# Patient Record
Sex: Male | Born: 1975
Health system: Southern US, Community
[De-identification: ages and names within clinical notes are randomized; demographics above are authoritative.]

## PROBLEM LIST (undated history)

## (undated) ENCOUNTER — Emergency Department (HOSPITAL_COMMUNITY): Admission: EM | Payer: Self-pay

## (undated) DIAGNOSIS — I10 Essential (primary) hypertension: Secondary | ICD-10-CM

## (undated) DIAGNOSIS — T7840XA Allergy, unspecified, initial encounter: Secondary | ICD-10-CM

## (undated) HISTORY — DX: Allergy, unspecified, initial encounter: T78.40XA

## (undated) HISTORY — DX: Essential (primary) hypertension: I10

---

## 2001-05-14 ENCOUNTER — Emergency Department (HOSPITAL_COMMUNITY): Admission: EM | Admit: 2001-05-14 | Discharge: 2001-05-14 | Payer: Self-pay | Admitting: *Deleted

## 2003-01-29 HISTORY — PX: KIDNEY DONATION: SHX685

## 2003-09-12 ENCOUNTER — Emergency Department (HOSPITAL_COMMUNITY): Admission: EM | Admit: 2003-09-12 | Discharge: 2003-09-12 | Payer: Self-pay | Admitting: Emergency Medicine

## 2004-10-06 ENCOUNTER — Emergency Department (HOSPITAL_COMMUNITY): Admission: EM | Admit: 2004-10-06 | Discharge: 2004-10-06 | Payer: Self-pay | Admitting: Family Medicine

## 2006-07-05 ENCOUNTER — Emergency Department (HOSPITAL_COMMUNITY): Admission: EM | Admit: 2006-07-05 | Discharge: 2006-07-05 | Payer: Self-pay | Admitting: Family Medicine

## 2009-03-22 ENCOUNTER — Emergency Department (HOSPITAL_COMMUNITY): Admission: EM | Admit: 2009-03-22 | Discharge: 2009-03-22 | Payer: Self-pay | Admitting: Emergency Medicine

## 2009-07-13 ENCOUNTER — Emergency Department (HOSPITAL_COMMUNITY): Admission: EM | Admit: 2009-07-13 | Discharge: 2009-07-13 | Payer: Self-pay | Admitting: Emergency Medicine

## 2010-04-20 LAB — GC/CHLAMYDIA PROBE AMP, GENITAL
Chlamydia, DNA Probe: NEGATIVE
GC Probe Amp, Genital: NEGATIVE

## 2013-05-26 ENCOUNTER — Ambulatory Visit: Payer: Self-pay | Admitting: Physician Assistant

## 2013-05-26 VITALS — BP 130/90 | HR 73 | Temp 99.5°F | Resp 16 | Ht 70.0 in | Wt 191.0 lb

## 2013-05-26 DIAGNOSIS — Z0289 Encounter for other administrative examinations: Secondary | ICD-10-CM

## 2013-05-26 NOTE — Progress Notes (Signed)
This patient presents for DOT examination for fitness for duty.  Last DOT certification was for 2 years, expiration date 02/2013.  Medical History: no  Any illness or injury in the last 5 years? no  Head/Brain Injuries, disorders or illnesses no  Seizures, epilepsy no  Eye disorders or impaired vision (except corrective lenses) no  Ear disorders, loss of hearing or balance no  Heart disease or heart attack; other cardiovascular condition no  Heart surgery (valve replacement/bypass, angioplasty, pacemaker) no  High blood pressure no  Muscular disease no  Shortness of breath no  Lung disease, emphysema, asthma, chronic bronchitis no  Kidney disease, dialysis no  Liver disease no  Digestive problems no  Diabetes or elevated blood sugar no  Nervious or psychiatric disorders, e.g., severe depression no  Loss of, or altered consciousness no  Fainting, dizziness no  Sleep disorders, pauses in breathing while asleep, daytime sleepiness, loud snoring no  Stroke or paralysis no  Missing or impaired hand, arm, foot, leg, finger, toe no  Spinal injury or disease no  Chronic low back pain no  Regular, frequent alcohol use no  Narcotic or habit forming drug use  Current Medications: Prior to Admission medications   Not on File    Primary Care Provider: No PCP Per Patient Specialists: none  Medical Examiner's Comments on Health History:  Allergic rhinitis. Does not use antihistamines that cause drowsiness. USes nasal decongestant spray.  TESTING:   Visual Acuity Screening   Right eye Left eye Both eyes  Without correction: 15-2 20/20 15-1  With correction:     Comments: Peripheral Vision: Right eye 85 degrees. Left eye 85 degrees.The patient can distinguish the colors red, amber and green.  Hearing Screening Comments: The patient was able to hear a forced whisper from 10 feet.  Monocular Vision: no  Hearing Aid used for test: no Hearing Aid required to to meet standard:  no  BP 130/90  Pulse 73  Temp(Src) 99.5 F (37.5 C) (Oral)  Resp 16  Ht 5\' 10"  (1.778 m)  Wt 191 lb (86.637 kg)  BMI 27.41 kg/m2  SpO2 99% Pulse rate is regular  Urine Specimen: Specific Gravity 1.010, Protein TRACE, Blood NEG, Sugar NEG  Other Testing: none indicated  PHYSICAL EXAMINATION:  1. yes General Appearance 2. no Eyes   3. no Ears     4. no Mouth and Throat    5. no Heart     6. no Lungs and Chest, not including breast examination  7. no Abdomen and Viscera   8. no Vascular System    9. no Genitourinary System   10. no Extremities-Limb impaired.  11. no Spine, other musculoskeletal  12. no Neurological     Comments: patient is obese. Counseled on healthy eating and regular exercise for weight loss and reduction of cardiovascular risk factors.  Certification Status: does meet standards for 2 year certificate.  Certification expires 05/27/2015

## 2013-05-26 NOTE — Patient Instructions (Signed)
Stop using Afrin, and any other nasal decongestant sprays. Use Flonase or Nasonex instead. Your nasal congestion will get worse at first, and then will improve. Instead of using "cold and allergy" combination medication, use Claritin (loratadine) or Allegra (fexofenadine). They won't make you sleepy, and are safe to use when driving.

## 2013-05-26 NOTE — Progress Notes (Signed)
U/A SpGr- 1.010 Protein- Trace Blood- Neg Glucose- Neg

## 2014-04-18 ENCOUNTER — Emergency Department (HOSPITAL_COMMUNITY)
Admission: EM | Admit: 2014-04-18 | Discharge: 2014-04-18 | Disposition: A | Discharge status: Home / Self Care | Payer: Self-pay | Source: Home / Self Care

## 2014-04-18 NOTE — ED Notes (Signed)
Patient was called two times and taken off the floor

## 2016-02-20 DIAGNOSIS — J3489 Other specified disorders of nose and nasal sinuses: Secondary | ICD-10-CM | POA: Diagnosis not present

## 2016-02-20 DIAGNOSIS — G4733 Obstructive sleep apnea (adult) (pediatric): Secondary | ICD-10-CM | POA: Diagnosis not present

## 2016-03-14 ENCOUNTER — Ambulatory Visit: Payer: BLUE CROSS/BLUE SHIELD | Attending: Otolaryngology

## 2016-03-14 DIAGNOSIS — G4733 Obstructive sleep apnea (adult) (pediatric): Secondary | ICD-10-CM | POA: Insufficient documentation

## 2016-03-14 DIAGNOSIS — R0683 Snoring: Secondary | ICD-10-CM | POA: Diagnosis not present

## 2016-05-30 DIAGNOSIS — G4733 Obstructive sleep apnea (adult) (pediatric): Secondary | ICD-10-CM | POA: Diagnosis not present

## 2016-06-07 DIAGNOSIS — G4733 Obstructive sleep apnea (adult) (pediatric): Secondary | ICD-10-CM | POA: Diagnosis not present

## 2016-07-08 DIAGNOSIS — G4733 Obstructive sleep apnea (adult) (pediatric): Secondary | ICD-10-CM | POA: Diagnosis not present

## 2016-08-07 DIAGNOSIS — G4733 Obstructive sleep apnea (adult) (pediatric): Secondary | ICD-10-CM | POA: Diagnosis not present

## 2016-08-11 ENCOUNTER — Encounter (HOSPITAL_COMMUNITY): Payer: Self-pay | Admitting: *Deleted

## 2016-08-11 ENCOUNTER — Ambulatory Visit (HOSPITAL_COMMUNITY)
Admission: EM | Admit: 2016-08-11 | Discharge: 2016-08-11 | Disposition: A | Discharge status: Home / Self Care | Payer: BLUE CROSS/BLUE SHIELD | Attending: Internal Medicine | Admitting: Internal Medicine

## 2016-08-11 DIAGNOSIS — M5416 Radiculopathy, lumbar region: Secondary | ICD-10-CM | POA: Diagnosis not present

## 2016-08-11 MED ORDER — CYCLOBENZAPRINE HCL 10 MG PO TABS: 5.0000 mg | ORAL_TABLET | Freq: Three times a day (TID) | ORAL | 0 refills | Status: DC | PRN

## 2016-08-11 MED ORDER — PREDNISONE 50 MG PO TABS: 50.0000 mg | ORAL_TABLET | Freq: Every day | ORAL | 0 refills | Status: AC

## 2016-08-11 MED ORDER — NAPROXEN 500 MG PO TABS: 500.0000 mg | ORAL_TABLET | Freq: Two times a day (BID) | ORAL | 0 refills | Status: AC

## 2016-08-11 NOTE — Discharge Instructions (Signed)
Start the prednisone and take as directed for three days.  Okay to start the flexeril as long as you are not driving within six hours of taking.  Start naprosyn if needed after finishing the prednisone.

## 2016-08-11 NOTE — ED Triage Notes (Signed)
C/O bilat thigh numbness and pain x 1.5 months.  Denies any back or buttock pain.  Has been using BioFreeze and using massage without relief.  Denies loss of control of bowels or bladder.

## 2016-08-11 NOTE — ED Provider Notes (Signed)
    08/11/2016 5:34 PM   DOB: 02/27/1975 / MRN: [id number]  SUBJECTIVE:  Fred Holt is a 41 y.o. male presenting for bilateral buring and pain about the L2-L3 dermatome.  This has been present and stable for about 1 month.  He has tried tylenol.  Denies a history of back injury or trauma.  Was a football player in highschool.  It currently a truck driver.   He has No Known Allergies.   He  has a past medical history of Allergy.    He  reports that he has never smoked. He has never used smokeless tobacco. He reports that he does not drink alcohol or use drugs. He  has no sexual activity history on file. The patient  has a past surgical history that includes Kidney donation (Left, 2005).  His family history includes Diabetes in his mother; Kidney disease in his sister.  Review of Systems  Musculoskeletal: Positive for myalgias. Negative for back pain, falls, joint pain and neck pain.  Skin: Negative for rash.  Neurological: Negative for dizziness and focal weakness.    The problem list and medications weare reviewed and updated by myself where necessary and exist elsewhere in the encounter.   OBJECTIVE:  BP 139/65   Pulse 69   Temp 98.3 F (36.8 C) (Oral)   Resp 16   SpO2 98%   Physical Exam  Constitutional: He is oriented to person, place, and time. He appears well-developed. He is active and cooperative.  Non-toxic appearance.  Eyes: Pupils are equal, round, and reactive to light. EOM are normal.  Cardiovascular: Normal rate.   Pulmonary/Chest: Effort normal. No tachypnea.  Musculoskeletal: Normal range of motion. He exhibits no edema, tenderness or deformity.  Neurological: He is alert and oriented to person, place, and time. He has normal strength and normal reflexes. He is not disoriented. He displays normal reflexes. No cranial nerve deficit or sensory deficit. He exhibits normal muscle tone. Coordination and gait normal.  Skin: Skin is warm and dry. He is not  diaphoretic. No pallor.  Psychiatric: His behavior is normal.  Vitals reviewed.   No results found for this or any previous visit (from the past 72 hour(s)).  No results found.  ASSESSMENT AND PLAN:  Lumbar radicular pain  No concerning exam findings.  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  An xray would not change my management. Will treat symptomatically for now.   The patient is advised to call or return to clinic if he does not see an improvement in symptoms, or to seek the care of the closest emergency department if he worsens with the above plan.   Deliah BostonMichael Clark, MHS, PA-C [facility] 08/11/2016 5:34 PM    Ofilia Neaslark, Michael L, PA-C 08/11/16 1750

## 2016-08-20 ENCOUNTER — Encounter: Payer: Self-pay | Admitting: Family Medicine

## 2016-08-20 ENCOUNTER — Ambulatory Visit (INDEPENDENT_AMBULATORY_CARE_PROVIDER_SITE_OTHER): Payer: BLUE CROSS/BLUE SHIELD

## 2016-08-20 ENCOUNTER — Ambulatory Visit (INDEPENDENT_AMBULATORY_CARE_PROVIDER_SITE_OTHER): Payer: BLUE CROSS/BLUE SHIELD | Admitting: Family Medicine

## 2016-08-20 VITALS — BP 147/85 | HR 73 | Temp 98.9°F | Resp 17 | Ht 70.0 in | Wt 300.6 lb

## 2016-08-20 DIAGNOSIS — R7303 Prediabetes: Secondary | ICD-10-CM | POA: Diagnosis not present

## 2016-08-20 DIAGNOSIS — R030 Elevated blood-pressure reading, without diagnosis of hypertension: Secondary | ICD-10-CM | POA: Diagnosis not present

## 2016-08-20 DIAGNOSIS — M545 Low back pain, unspecified: Secondary | ICD-10-CM

## 2016-08-20 DIAGNOSIS — Z833 Family history of diabetes mellitus: Secondary | ICD-10-CM

## 2016-08-20 DIAGNOSIS — Z8249 Family history of ischemic heart disease and other diseases of the circulatory system: Secondary | ICD-10-CM | POA: Diagnosis not present

## 2016-08-20 DIAGNOSIS — Z131 Encounter for screening for diabetes mellitus: Secondary | ICD-10-CM

## 2016-08-20 DIAGNOSIS — Z125 Encounter for screening for malignant neoplasm of prostate: Secondary | ICD-10-CM

## 2016-08-20 NOTE — Progress Notes (Signed)
Chief Complaint  Patient presents with  . 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  He was given prednisone, flexeril and naproxen He is still taking flexeril and naproxen He states that  He denies numbness and tingling He reports that   Elevated BP without diagnosis of hypertension BP Readings from Last 3 Encounters:  08/20/16 (!) 147/85  08/11/16 139/65  05/26/13 130/90  he reports that his father has hypertension and so does his mother He takes a pill He reports that he has been gaining weight He states that when he gets off work late sometimes He is exercising sometimes and is trying to improve his diet Wt Readings from Last 3 Encounters:  08/20/16 (!) 300 lb 9.6 oz (136.4 kg)  05/26/13 191 lb (86.6 kg)       Past Medical History:  Diagnosis Date  . Allergy     Current Outpatient Prescriptions  Medication Sig Dispense Refill  . cyclobenzaprine (FLEXERIL) 10 MG tablet Take 0.5-1 tablets (5-10 mg total) by mouth 3 (three) times daily as needed for muscle spasms. 30 tablet 0  . MULTIPLE VITAMINS PO Take by mouth.    . naproxen (NAPROSYN) 500 MG tablet Take 1 tablet (500 mg total) by mouth 2 (two) times daily with a meal. Avoid NSAID medications while taking this. 20 tablet 0   No current facility-administered medications for this visit.     Allergies: Not on File  Past Surgical History:  Procedure Laterality Date  . KIDNEY DONATION Left 2005   donation to sister    Social History   Social History    . Marital status: Single    Spouse name: n/a  . Number of children: 6  . Years of education: 5414   Occupational History  . truck driver    Social History Main Topics  . Smoking status: Never Smoker  . Smokeless tobacco: Never Used  . Alcohol use No  . Drug use: No  . Sexual activity: Not Asked   Other Topics Concern  . None   Social History Narrative   Lives alone. Sees his children daily.    ROS See hpi  Objective: Vitals:   08/20/16 1655 08/20/16 1657  BP: (!) 160/102 (!) 147/85  Pulse: 76 73  Resp: 17   Temp: 98.9 F (37.2 C)   TempSrc: Oral   SpO2: 94%   Weight: (!) 300 lb 9.6 oz (136.4 kg)   Height: 5\' 10"  (1.778 m)   Body mass index is 43.13 kg/m.   Physical Exam  Constitutional: He is oriented to person, place, and time. He appears well-developed and well-nourished.  HENT:  Head: Normocephalic and atraumatic.  Cardiovascular: Normal rate, regular rhythm and normal heart sounds.   Pulmonary/Chest: Effort normal and breath sounds normal. No respiratory distress. He has no wheezes.  Musculoskeletal:       Lumbar back: He exhibits tenderness and spasm. He  exhibits normal range of motion, no bony tenderness, no swelling, no edema, no deformity, no laceration, no pain and normal pulse.  Neurological: He is alert and oriented to person, place, and time. He displays normal reflexes. No cranial nerve deficit. Coordination normal.   XRAY 08/20/16: Mild degenerative change without acute abnormality  Assessment and Plan Adynn was seen today for hospitalization follow-up.  Diagnoses and all orders for this visit:  Elevated BP without diagnosis of hypertension Pt prehypertensive Discussed how the diagnosis of hypertension is made Will monitor Discussed nsaid use as this can raise the blood pressure -     Comprehensive metabolic panel  Morbid obesity (HCC)- will screen for modifiable risk factors Discussed exercise programs -     Lipid panel -      Hemoglobin A1c  Family history of hypertension Family history of diabetes mellitus in mother Pt currently prediabetic He could benefit from increased exercise and weight loss to prevent hypertension and diabetes He declined nutrition referral today -     Hemoglobin A1c  Lumbar pain- concerning about wear and tear degenerative disease vs. Muscular strain Will refer to PT No neuro deficits noted on exam -     Ambulatory referral to Physical Therapy -     DG Lumbar Spine Complete  Screening for diabetes mellitus- will screen based on age -     Hemoglobin A1c  Screening for prostate cancer -     PSA     Fred Holt A Creta Levin

## 2016-08-20 NOTE — Patient Instructions (Addendum)
   IF you received an x-ray today, you will receive an invoice from [facility]. Please contact [facility] at [phone] with questions or concerns regarding your invoice.   IF you received labwork today, you will receive an invoice from [facility]. Please contact [facility] at [phone] with questions or concerns regarding your invoice.   Our billing staff will not be able to assist you with questions regarding bills from these companies.  You will be contacted with the lab results as soon as they are available. The fastest way to get your results is to activate your My Chart account. Instructions are located on the last page of this paperwork. If you have not heard from us regarding the results in 2 weeks, please contact this office.     DASH Eating Plan DASH stands for "Dietary Approaches to Stop Hypertension." The DASH eating plan is a healthy eating plan that has been shown to reduce high blood pressure (hypertension). It may also reduce your risk for type 2 diabetes, heart disease, and stroke. The DASH eating plan may also help with weight loss. What are tips for following this plan? General guidelines   Avoid eating more than 2,300 mg (milligrams) of salt (sodium) a day. If you have hypertension, you may need to reduce your sodium intake to 1,500 mg a day.  Limit alcohol intake to no more than 1 drink a day for nonpregnant women and 2 drinks a day for men. One drink equals 12 oz of beer, 5 oz of wine, or 1 oz of hard liquor.  Work with your health care provider to maintain a healthy body weight or to lose weight. Ask what an ideal weight is for you.  Get at least 30 minutes of exercise that causes your heart to beat faster (aerobic exercise) most days of the week. Activities may include walking, swimming, or biking.  Work with your health care provider or diet and nutrition specialist (dietitian) to adjust your eating plan to your individual calorie  needs. Reading food labels   Check food labels for the amount of sodium per serving. Choose foods with less than 5 percent of the Daily Value of sodium. Generally, foods with less than 300 mg of sodium per serving fit into this eating plan.  To find whole grains, look for the word "whole" as the first word in the ingredient list. Shopping   Buy products labeled as "low-sodium" or "no salt added."  Buy fresh foods. Avoid canned foods and premade or frozen meals. Cooking   Avoid adding salt when cooking. Use salt-free seasonings or herbs instead of table salt or sea salt. Check with your health care provider or pharmacist before using salt substitutes.  Do not fry foods. Cook foods using healthy methods such as baking, boiling, grilling, and broiling instead.  Cook with heart-healthy oils, such as olive, canola, soybean, or sunflower oil. Meal planning    Eat a balanced diet that includes:  5 or more servings of fruits and vegetables each day. At each meal, try to fill half of your plate with fruits and vegetables.  Up to 6-8 servings of whole grains each day.  Less than 6 oz of lean meat, poultry, or fish each day. A 3-oz serving of meat is about the same size as a deck of cards. One egg equals 1 oz.  2 servings of low-fat dairy each day.  A serving of nuts, seeds, or beans 5 times each week.  Heart-healthy fats. Healthy fats called   Omega-3 fatty acids are found in foods such as flaxseeds and coldwater fish, like sardines, salmon, and mackerel.  Limit how much you eat of the following:  Canned or prepackaged foods.  Food that is high in trans fat, such as fried foods.  Food that is high in saturated fat, such as fatty meat.  Sweets, desserts, sugary drinks, and other foods with added sugar.  Full-fat dairy products.  Do not salt foods before eating.  Try to eat at least 2 vegetarian meals each week.  Eat more home-cooked food and less restaurant, buffet, and fast  food.  When eating at a restaurant, ask that your food be prepared with less salt or no salt, if possible. What foods are recommended? The items listed may not be a complete list. Talk with your dietitian about what dietary choices are best for you. Grains  Whole-grain or whole-wheat bread. Whole-grain or whole-wheat pasta. Brown rice. Oatmeal. Quinoa. Bulgur. Whole-grain and low-sodium cereals. Pita bread. Low-fat, low-sodium crackers. Whole-wheat flour tortillas. Vegetables  Fresh or frozen vegetables (raw, steamed, roasted, or grilled). Low-sodium or reduced-sodium tomato and vegetable juice. Low-sodium or reduced-sodium tomato sauce and tomato paste. Low-sodium or reduced-sodium canned vegetables. Fruits  All fresh, dried, or frozen fruit. Canned fruit in natural juice (without added sugar). Meat and other protein foods  Skinless chicken or turkey. Ground chicken or turkey. Pork with fat trimmed off. Fish and seafood. Egg whites. Dried beans, peas, or lentils. Unsalted nuts, nut butters, and seeds. Unsalted canned beans. Lean cuts of beef with fat trimmed off. Low-sodium, lean deli meat. Dairy  Low-fat (1%) or fat-free (skim) milk. Fat-free, low-fat, or reduced-fat cheeses. Nonfat, low-sodium ricotta or cottage cheese. Low-fat or nonfat yogurt. Low-fat, low-sodium cheese. Fats and oils  Soft margarine without trans fats. Vegetable oil. Low-fat, reduced-fat, or light mayonnaise and salad dressings (reduced-sodium). Canola, safflower, olive, soybean, and sunflower oils. Avocado. Seasoning and other foods  Herbs. Spices. Seasoning mixes without salt. Unsalted popcorn and pretzels. Fat-free sweets. What foods are not recommended? The items listed may not be a complete list. Talk with your dietitian about what dietary choices are best for you. Grains  Baked goods made with fat, such as croissants, muffins, or some breads. Dry pasta or rice meal packs. Vegetables  Creamed or fried vegetables.  Vegetables in a cheese sauce. Regular canned vegetables (not low-sodium or reduced-sodium). Regular canned tomato sauce and paste (not low-sodium or reduced-sodium). Regular tomato and vegetable juice (not low-sodium or reduced-sodium). Pickles. Olives. Fruits  Canned fruit in a light or heavy syrup. Fried fruit. Fruit in cream or butter sauce. Meat and other protein foods  Fatty cuts of meat. Ribs. Fried meat. Bacon. Sausage. Bologna and other processed lunch meats. Salami. Fatback. Hotdogs. Bratwurst. Salted nuts and seeds. Canned beans with added salt. Canned or smoked fish. Whole eggs or egg yolks. Chicken or turkey with skin. Dairy  Whole or 2% milk, cream, and half-and-half. Whole or full-fat cream cheese. Whole-fat or sweetened yogurt. Full-fat cheese. Nondairy creamers. Whipped toppings. Processed cheese and cheese spreads. Fats and oils  Butter. Stick margarine. Lard. Shortening. Ghee. Bacon fat. Tropical oils, such as coconut, palm kernel, or palm oil. Seasoning and other foods  Salted popcorn and pretzels. Onion salt, garlic salt, seasoned salt, table salt, and sea salt. Worcestershire sauce. Tartar sauce. Barbecue sauce. Teriyaki sauce. Soy sauce, including reduced-sodium. Steak sauce. Canned and packaged gravies. Fish sauce. Oyster sauce. Cocktail sauce. Horseradish that you find on the shelf. Ketchup. Mustard. Meat flavorings   and tenderizers. Bouillon cubes. Hot sauce and Tabasco sauce. Premade or packaged marinades. Premade or packaged taco seasonings. Relishes. Regular salad dressings. Where to find more information:  National Heart, Lung, and Blood Institute: www.nhlbi.nih.gov  American Heart Association: www.heart.org Summary  The DASH eating plan is a healthy eating plan that has been shown to reduce high blood pressure (hypertension). It may also reduce your risk for type 2 diabetes, heart disease, and stroke.  With the DASH eating plan, you should limit salt (sodium) intake  to 2,300 mg a day. If you have hypertension, you may need to reduce your sodium intake to 1,500 mg a day.  When on the DASH eating plan, aim to eat more fresh fruits and vegetables, whole grains, lean proteins, low-fat dairy, and heart-healthy fats.  Work with your health care provider or diet and nutrition specialist (dietitian) to adjust your eating plan to your individual calorie needs. This information is not intended to replace advice given to you by your health care provider. Make sure you discuss any questions you have with your health care provider. Document Released: 01/03/2011 Document Revised: 01/08/2016 Document Reviewed: 01/08/2016 Elsevier Interactive Patient Education  2017 [employer]  

## 2016-08-21 LAB — COMPREHENSIVE METABOLIC PANEL
ALBUMIN: 4.2 g/dL (ref 3.5–5.5)
ALK PHOS: 85 IU/L (ref 39–117)
ALT: 43 IU/L (ref 0–44)
AST: 33 IU/L (ref 0–40)
Albumin/Globulin Ratio: 1.5 (ref 1.2–2.2)
BUN / CREAT RATIO: 12 (ref 9–20)
BUN: 19 mg/dL (ref 6–24)
Bilirubin Total: 0.4 mg/dL (ref 0.0–1.2)
CO2: 24 mmol/L (ref 20–29)
CREATININE: 1.62 mg/dL — AB (ref 0.76–1.27)
Calcium: 9.7 mg/dL (ref 8.7–10.2)
Chloride: 105 mmol/L (ref 96–106)
GFR calc non Af Amer: 52 mL/min/{1.73_m2} — ABNORMAL LOW (ref >59)
GFR, EST AFRICAN AMERICAN: 60 mL/min/{1.73_m2} (ref >59)
GLOBULIN, TOTAL: 2.8 g/dL (ref 1.5–4.5)
Glucose: 92 mg/dL (ref 65–99)
Potassium: 4.6 mmol/L (ref 3.5–5.2)
SODIUM: 145 mmol/L — AB (ref 134–144)
Total Protein: 7 g/dL (ref 6.0–8.5)

## 2016-08-21 LAB — LIPID PANEL
CHOL/HDL RATIO: 5.2 ratio — AB (ref 0.0–5.0)
CHOLESTEROL TOTAL: 150 mg/dL (ref 100–199)
HDL: 29 mg/dL — ABNORMAL LOW (ref >39)
LDL CALC: 83 mg/dL (ref 0–99)
Triglycerides: 188 mg/dL — ABNORMAL HIGH (ref 0–149)
VLDL Cholesterol Cal: 38 mg/dL (ref 5–40)

## 2016-08-21 LAB — HEMOGLOBIN A1C
ESTIMATED AVERAGE GLUCOSE: 128 mg/dL
HEMOGLOBIN A1C: 6.1 % — AB (ref 4.8–5.6)

## 2016-08-21 LAB — PSA: PROSTATE SPECIFIC AG, SERUM: 1.1 ng/mL (ref 0.0–4.0)

## 2016-09-07 DIAGNOSIS — G4733 Obstructive sleep apnea (adult) (pediatric): Secondary | ICD-10-CM | POA: Diagnosis not present

## 2016-09-11 DIAGNOSIS — R7303 Prediabetes: Secondary | ICD-10-CM | POA: Insufficient documentation

## 2016-09-24 ENCOUNTER — Encounter: Payer: Self-pay | Admitting: Family Medicine

## 2016-09-24 ENCOUNTER — Ambulatory Visit (INDEPENDENT_AMBULATORY_CARE_PROVIDER_SITE_OTHER): Payer: BLUE CROSS/BLUE SHIELD | Admitting: Family Medicine

## 2016-09-24 VITALS — BP 147/91 | HR 70 | Temp 98.1°F | Resp 18 | Ht 70.0 in | Wt 304.8 lb

## 2016-09-24 DIAGNOSIS — N182 Chronic kidney disease, stage 2 (mild): Secondary | ICD-10-CM

## 2016-09-24 DIAGNOSIS — I10 Essential (primary) hypertension: Secondary | ICD-10-CM | POA: Diagnosis not present

## 2016-09-24 DIAGNOSIS — R7303 Prediabetes: Secondary | ICD-10-CM

## 2016-09-24 MED ORDER — HYDROCHLOROTHIAZIDE 25 MG PO TABS: 12.5000 mg | ORAL_TABLET | Freq: Every day | ORAL | 1 refills | Status: DC

## 2016-09-24 MED ORDER — TRAMADOL HCL 50 MG PO TABS: 25.0000 mg | ORAL_TABLET | Freq: Three times a day (TID) | ORAL | 0 refills | Status: DC | PRN

## 2016-09-24 NOTE — Progress Notes (Signed)
Chief Complaint  Patient presents with  . Hypertension    follow up, per pt he is also here because of issue with kidney told to stop taking medication( pain med)     HPI   Hypertension: Patient here for follow-up of elevated blood pressure. He is exercising as a part of walking at work and is adherent to low salt diet.  Blood pressure is not checked at home.  Cardiac symptoms none. Patient denies chest pain, chest pressure/discomfort, claudication, exertional chest pressure/discomfort, fatigue and irregular heart beat.  Cardiovascular risk factors: dyslipidemia, hypertension, male gender, obesity (BMI >= 30 kg/m2) and one kidney.   This morning he ate greens, chicken breast and corn on the cob He states that he is not exercising He signed up at the gym BP Readings from Last 3 Encounters:  09/24/16 (!) 147/91  08/20/16 (!) 147/85  08/11/16 139/65   Obesity Wt Readings from Last 3 Encounters:  09/24/16 (!) 304 lb 12.8 oz (138.3 kg)  08/20/16 (!) 300 lb 9.6 oz (136.4 kg)  05/26/13 191 lb (86.6 kg)  At home he is about 293 pounds first thing in the morning He has been cutting back on the fast foods He states that he has some family history of diabets.   CKD He reports that 14 years ago he gave his sister a kidney He takes nsaids for his sinus headaches which give him a frontal headache that stops him from working This past month he has rarely taken any nsaids this month but tylenol just was not helping He reports that he took about one every 10 days when the headaches get bad His headaches are triggered by congestion from the fan blowing on him at night.  He is concerned about his creatinine but does not want any medications in his body.  Prediabetes Lab Results  Component Value Date   HGBA1C 6.1 (H) 08/20/2016   Pt reports that his brother died from complications of diabetes. His sister is currently getting amputations and his mother is diabetic. He does not want any  medications because he is afraid of erectile dysfunction and becoming dependent.  He wants to join the gym and lose weight naturally. He drinks gallons of water and goes to the bathroom to urinate about 10 times a day.   Past Medical History:  Diagnosis Date  . Allergy     Current Outpatient Prescriptions  Medication Sig Dispense Refill  . cyclobenzaprine (FLEXERIL) 10 MG tablet Take 0.5-1 tablets (5-10 mg total) by mouth 3 (three) times daily as needed for muscle spasms. 30 tablet 0  . MULTIPLE VITAMINS PO Take by mouth.    . hydrochlorothiazide (HYDRODIURIL) 25 MG tablet Take 0.5 tablets (12.5 mg total) by mouth daily. 30 tablet 1  . traMADol (ULTRAM) 50 MG tablet Take 0.5 tablets (25 mg total) by mouth every 8 (eight) hours as needed for moderate pain. 30 tablet 0   No current facility-administered medications for this visit.     Allergies: No Known Allergies  Past Surgical History:  Procedure Laterality Date  . KIDNEY DONATION Left 2005   donation to sister    Social History   Social History  . Marital status: Single    Spouse name: n/a  . Number of children: 6  . Years of education: 11   Occupational History  . truck driver    Social History Main Topics  . Smoking status: Never Smoker  . Smokeless tobacco: Never Used  . Alcohol use No  .  Drug use: No  . Sexual activity: Not on file   Other Topics Concern  . Not on file   Social History Narrative   Lives alone. Sees his children daily.    Review of Systems  Constitutional: Negative for chills and fever.  HENT: Positive for congestion. Negative for sore throat.   Gastrointestinal: Negative for abdominal pain, nausea and vomiting.  Skin: Negative for itching and rash.  Neurological: Positive for headaches. Negative for dizziness, tingling and tremors.   See hpi  Objective: Vitals:   09/24/16 1502  BP: (!) 147/91  Pulse: 70  Resp: 18  Temp: 98.1 F (36.7 C)  TempSrc: Oral  SpO2: 97%  Weight:  (!) 304 lb 12.8 oz (138.3 kg)  Height: 5\' 10"  (1.778 m)    Physical Exam  Constitutional: He is oriented to person, place, and time. He appears well-developed and well-nourished.  HENT:  Head: Normocephalic and atraumatic.  Eyes: Conjunctivae and EOM are normal.  Cardiovascular: Normal rate, regular rhythm and normal heart sounds.   Pulmonary/Chest: Effort normal and breath sounds normal. No respiratory distress. He has no wheezes.  Neurological: He is alert and oriented to person, place, and time.  Psychiatric: He has a normal mood and affect. His behavior is normal. Judgment and thought content normal.      Assessment and Plan Rhylen was seen today for hypertension.  Diagnoses and all orders for this visit:  Essential hypertension- will try low dose hctz With pt only having one kidney will follow closely Avoided CCB and beta blocker for fear of noncompliance due to dizziness or erectile dysfunction Pt is very disagreeable to meds but agreed to try for the next 3 months to see if he can get better control of his pressures -     Basic metabolic panel  Stage 2 chronic kidney disease- continue to avoid nsaids Gave tramadol at a low dose since pt is worried about addiction Continue tylenol prn -     Basic metabolic panel  Morbid obesity (HCC)- discussed cardio and exercise for weight loss Discussed diet  Prediabetes- discussed diet and metformin Pt declined Will recheck a1c in one month  Other orders -     Cancel: Tdap vaccine greater than or equal to 7yo IM -     Cancel: Flu Vaccine QUAD 36+ mos IM -     hydrochlorothiazide (HYDRODIURIL) 25 MG tablet; Take 0.5 tablets (12.5 mg total) by mouth daily. -     traMADol (ULTRAM) 50 MG tablet; Take 0.5 tablets (25 mg total) by mouth every 8 (eight) hours as needed for moderate pain.   A total of 40 minutes were spent face-to-face with the patient during this encounter and over half of that time was spent on counseling and  coordination of care.   Yalena Colon A Sahory Nordling

## 2016-09-24 NOTE — Patient Instructions (Addendum)
   IF you received an x-ray today, you will receive an invoice from [facility]. Please contact [facility] at [phone] with questions or concerns regarding your invoice.   IF you received labwork today, you will receive an invoice from [facility]. Please contact [facility] at [phone] with questions or concerns regarding your invoice.   Our billing staff will not be able to assist you with questions regarding bills from these companies.  You will be contacted with the lab results as soon as they are available. The fastest way to get your results is to activate your My Chart account. Instructions are located on the last page of this paperwork. If you have not heard from us regarding the results in 2 weeks, please contact this office.    Prediabetes Eating Plan Prediabetes-also called impaired glucose tolerance or impaired fasting glucose-is a condition that causes blood sugar (blood glucose) levels to be higher than normal. Following a healthy diet can help to keep prediabetes under control. It can also help to lower the risk of type 2 diabetes and heart disease, which are increased in people who have prediabetes. Along with regular exercise, a healthy diet:  Promotes weight loss.  Helps to control blood sugar levels.  Helps to improve the way that the body uses insulin.  What do I need to know about this eating plan?  Use the glycemic index (GI) to plan your meals. The index tells you how quickly a food will raise your blood sugar. Choose low-GI foods. These foods take a longer time to raise blood sugar.  Pay close attention to the amount of carbohydrates in the food that you eat. Carbohydrates increase blood sugar levels.  Keep track of how many calories you take in. Eating the right amount of calories will help you to achieve a healthy weight. Losing about 7 percent of your starting weight can help to prevent type 2 diabetes.  You may want to follow a  Mediterranean diet. This diet includes a lot of vegetables, lean meats or fish, whole grains, fruits, and healthy oils and fats. What foods can I eat? Grains Whole grains, such as whole-wheat or whole-grain breads, crackers, cereals, and pasta. Unsweetened oatmeal. Bulgur. Barley. Quinoa. Brown rice. Corn or whole-wheat flour tortillas or taco shells. Vegetables Lettuce. Spinach. Peas. Beets. Cauliflower. Cabbage. Broccoli. Carrots. Tomatoes. Squash. Eggplant. Herbs. Peppers. Onions. Cucumbers. Brussels sprouts. Fruits Berries. Bananas. Apples. Oranges. Grapes. Papaya. Mango. Pomegranate. Kiwi. Grapefruit. Cherries. Meats and Other Protein Sources Seafood. Lean meats, such as chicken and turkey or lean cuts of pork and beef. Tofu. Eggs. Nuts. Beans. Dairy Low-fat or fat-free dairy products, such as yogurt, cottage cheese, and cheese. Beverages Water. Tea. Coffee. Sugar-free or diet soda. Seltzer water. Milk. Milk alternatives, such as soy or almond milk. Condiments Mustard. Relish. Low-fat, low-sugar ketchup. Low-fat, low-sugar barbecue sauce. Low-fat or fat-free mayonnaise. Sweets and Desserts Sugar-free or low-fat pudding. Sugar-free or low-fat ice cream and other frozen treats. Fats and Oils Avocado. Walnuts. Olive oil. The items listed above may not be a complete list of recommended foods or beverages. Contact your dietitian for more options. What foods are not recommended? Grains Refined white flour and flour products, such as bread, pasta, snack foods, and cereals. Beverages Sweetened drinks, such as sweet iced tea and soda. Sweets and Desserts Baked goods, such as cake, cupcakes, pastries, cookies, and cheesecake. The items listed above may not be a complete list of foods and beverages to avoid. Contact your dietitian for more information.   This information is not intended to replace advice given to you by your health care provider. Make sure you discuss any questions you have with  your health care provider. Document Released: 05/31/2014 Document Revised: 06/22/2015 Document Reviewed: 02/09/2014 Elsevier Interactive Patient Education  2017 Elsevier Inc.  

## 2016-09-25 LAB — BASIC METABOLIC PANEL
BUN/Creatinine Ratio: 8 — ABNORMAL LOW (ref 9–20)
BUN: 14 mg/dL (ref 6–24)
CALCIUM: 10 mg/dL (ref 8.7–10.2)
CHLORIDE: 103 mmol/L (ref 96–106)
CO2: 21 mmol/L (ref 20–29)
Creatinine, Ser: 1.65 mg/dL — ABNORMAL HIGH (ref 0.76–1.27)
GFR, EST AFRICAN AMERICAN: 59 mL/min/{1.73_m2} — AB (ref >59)
GFR, EST NON AFRICAN AMERICAN: 51 mL/min/{1.73_m2} — AB (ref >59)
Glucose: 71 mg/dL (ref 65–99)
POTASSIUM: 4.4 mmol/L (ref 3.5–5.2)
Sodium: 145 mmol/L — ABNORMAL HIGH (ref 134–144)

## 2016-10-08 DIAGNOSIS — G4733 Obstructive sleep apnea (adult) (pediatric): Secondary | ICD-10-CM | POA: Diagnosis not present

## 2016-10-14 ENCOUNTER — Ambulatory Visit: Payer: BLUE CROSS/BLUE SHIELD | Admitting: Family Medicine

## 2016-10-14 NOTE — Progress Notes (Deleted)
  No chief complaint on file.   HPI  Past Medical History:  Diagnosis Date  . Allergy     Current Outpatient Prescriptions  Medication Sig Dispense Refill  . cyclobenzaprine (FLEXERIL) 10 MG tablet Take 0.5-1 tablets (5-10 mg total) by mouth 3 (three) times daily as needed for muscle spasms. 30 tablet 0  . hydrochlorothiazide (HYDRODIURIL) 25 MG tablet Take 0.5 tablets (12.5 mg total) by mouth daily. 30 tablet 1  . MULTIPLE VITAMINS PO Take by mouth.    . traMADol (ULTRAM) 50 MG tablet Take 0.5 tablets (25 mg total) by mouth every 8 (eight) hours as needed for moderate pain. 30 tablet 0   No current facility-administered medications for this visit.     Allergies: No Known Allergies  Past Surgical History:  Procedure Laterality Date  . KIDNEY DONATION Left 2005   donation to sister    Social History   Social History  . Marital status: Single    Spouse name: n/a  . Number of children: 6  . Years of education: 22   Occupational History  . truck driver    Social History Main Topics  . Smoking status: Never Smoker  . Smokeless tobacco: Never Used  . Alcohol use No  . Drug use: No  . Sexual activity: Not on file   Other Topics Concern  . Not on file   Social History Narrative   Lives alone. Sees his children daily.    ROS  Objective: There were no vitals filed for this visit.  Physical Exam  Assessment and Plan There are no diagnoses linked to this encounter.   Delores P [employer]

## 2016-11-07 DIAGNOSIS — G4733 Obstructive sleep apnea (adult) (pediatric): Secondary | ICD-10-CM | POA: Diagnosis not present

## 2016-12-05 DIAGNOSIS — G4733 Obstructive sleep apnea (adult) (pediatric): Secondary | ICD-10-CM | POA: Diagnosis not present

## 2016-12-08 DIAGNOSIS — G4733 Obstructive sleep apnea (adult) (pediatric): Secondary | ICD-10-CM | POA: Diagnosis not present

## 2017-01-09 DIAGNOSIS — I10 Essential (primary) hypertension: Secondary | ICD-10-CM | POA: Diagnosis not present

## 2017-01-09 DIAGNOSIS — M79602 Pain in left arm: Secondary | ICD-10-CM | POA: Diagnosis not present

## 2017-01-09 DIAGNOSIS — Z9114 Patient's other noncompliance with medication regimen: Secondary | ICD-10-CM | POA: Diagnosis not present

## 2017-01-14 ENCOUNTER — Encounter (HOSPITAL_COMMUNITY): Payer: Self-pay | Admitting: Family Medicine

## 2017-01-14 ENCOUNTER — Ambulatory Visit (HOSPITAL_COMMUNITY)
Admission: EM | Admit: 2017-01-14 | Discharge: 2017-01-14 | Disposition: A | Discharge status: Home / Self Care | Payer: BLUE CROSS/BLUE SHIELD | Attending: Family Medicine | Admitting: Family Medicine

## 2017-01-14 DIAGNOSIS — M5412 Radiculopathy, cervical region: Secondary | ICD-10-CM

## 2017-01-14 MED ORDER — METHOCARBAMOL 500 MG PO TABS: 500.0000 mg | ORAL_TABLET | Freq: Two times a day (BID) | ORAL | 0 refills | Status: DC

## 2017-01-14 MED ORDER — TRAMADOL HCL 50 MG PO TABS: 50.0000 mg | ORAL_TABLET | Freq: Four times a day (QID) | ORAL | 0 refills | Status: DC | PRN

## 2017-01-14 NOTE — ED Triage Notes (Addendum)
Pt here for left neck, shoulder and arm pain that has been going on for over a week. Reports he has been taking prednisone and tramadol with no relief. Hurts with ROM in neck. [facility] and [facility] did [facility] EKG and it was normal.

## 2017-01-14 NOTE — Discharge Instructions (Signed)
Take the blood pressure medication (fluid pill)   Schedule appointment for recheck of your neck and blood pressure with your primary care MD.

## 2017-01-14 NOTE — ED Provider Notes (Signed)
[facility]    CSN: [id number] Arrival date & time: 01/14/17  1729     History   Chief Complaint Chief Complaint  Patient presents with  . Neck Pain  . Shoulder Pain  . Arm Pain    HPI Fred Holt is a 41 y.o. male.   The history is provided by the patient. No language interpreter was used.  Neck Pain  Pain location:  Generalized neck Quality:  Aching Pain radiates to:  L shoulder Pain severity:  Moderate Pain is:  Worse during the night Onset quality:  Gradual Duration:  2 weeks Timing:  Constant Progression:  Worsening Chronicity:  New Relieved by:  Nothing Worsened by:  Nothing Ineffective treatments:  None tried Associated symptoms: no weakness   Shoulder Pain  Associated symptoms: neck pain   Arm Pain     Past Medical History:  Diagnosis Date  . Allergy     Patient Active Problem List   Diagnosis Date Noted  . Prediabetes 09/11/2016    Past Surgical History:  Procedure Laterality Date  . KIDNEY DONATION Left 2005   donation to sister       Home Medications    Prior to Admission medications   Medication Sig Start Date End Date Taking? Authorizing Provider  lisinopril (PRINIVIL,ZESTRIL) 10 MG tablet Take 10 mg by mouth daily.   Yes [provider]  methocarbamol (ROBAXIN) 500 MG tablet Take 1 tablet (500 mg total) by mouth 2 (two) times daily. 01/14/17   Elson Areas, PA-C  MULTIPLE VITAMINS PO Take by mouth.    [provider]  traMADol (ULTRAM) 50 MG tablet Take 1 tablet (50 mg total) by mouth every 6 (six) hours as needed. 01/14/17   Elson Areas, PA-C    Family History Family History  Problem Relation Age of Onset  . Diabetes Mother   . Kidney disease Sister        s/p kidney transplant from this patient    Social History Social History   Tobacco Use  . Smoking status: Never Smoker  . Smokeless tobacco: Never Used  Substance Use Topics  . Alcohol use: No  . Drug use: No      Allergies   Patient has no known allergies.   Review of Systems Review of Systems  Musculoskeletal: Positive for neck pain.  Neurological: Negative for weakness.  All other systems reviewed and are negative.    Physical Exam Triage Vital Signs ED Triage Vitals  Enc Vitals Group     BP 01/14/17 1806 (!) 185/109     Pulse Rate 01/14/17 1806 76     Resp 01/14/17 1806 18     Temp 01/14/17 1806 98.6 F (37 C)     Temp src --      SpO2 01/14/17 1806 99 %     Weight --      Height --      Head Circumference --      Peak Flow --      Pain Score 01/14/17 1803 8     Pain Loc --      Pain Edu? --      Excl. in GC? --    No data found.  Updated Vital Signs BP (!) 185/109   Pulse 76   Temp 98.6 F (37 C)   Resp 18   SpO2 99%   Visual Acuity Right Eye Distance:   Left Eye Distance:   Bilateral Distance:    Right  Eye Near:   Left Eye Near:    Bilateral Near:     Physical Exam  Constitutional: He appears well-developed and well-nourished.  HENT:  Head: Normocephalic and atraumatic.  Eyes: Conjunctivae are normal.  Neck: Neck supple.  Cardiovascular: Normal rate and regular rhythm.  No murmur heard. Pulmonary/Chest: Effort normal and breath sounds normal. No respiratory distress.  Abdominal: Soft. There is no tenderness.  Musculoskeletal: He exhibits no edema.  Neurological: He is alert.  Skin: Skin is warm and dry.  Psychiatric: He has a normal mood and affect.  Nursing note and vitals reviewed.    UC Treatments / Results  Labs (all labs ordered are listed, but only abnormal results are displayed) Labs Reviewed - No data to display  EKG  EKG Interpretation None       Radiology No results found.  Procedures Procedures (including critical care time)  Medications Ordered in UC Medications - No data to display   Initial Impression / Assessment and Plan / UC Course  I have reviewed the triage vital signs and the nursing  notes.  Pertinent labs & imaging results that were available during my care of the patient were reviewed by me and considered in my medical decision making (see chart for details).     Pt is not taking his blood pressure medication.  Pt has a fluid pill at home.  Pt agrees to take after counseling about blood pressure.   Final Clinical Impressions(s) / UC Diagnoses   Final diagnoses:  Cervical radiculopathy    ED Discharge Orders        Ordered    methocarbamol (ROBAXIN) 500 MG tablet  2 times daily     01/14/17 1855    traMADol (ULTRAM) 50 MG tablet  Every 6 hours PRN     01/14/17 1855       Controlled Substance Prescriptions [place] Controlled Substance Registry consulted? Not Applicable  An After Visit Summary was printed and given to the patient.    Elson AreasSofia, Leslie K, [place] JerseyPA-C 01/14/17 2041

## 2017-01-15 ENCOUNTER — Ambulatory Visit: Payer: Self-pay

## 2017-01-15 NOTE — Telephone Encounter (Signed)
Pt calling with c/o headache. Per the chart he has HTN and questionable compliance with meds. Pt BP elevated 179/91 and 178/96. By the end of the call BP down to 157/92. Pt states Headache is a 2-3 and is located toward the front of his head. Pt denies any numbness, weakness, loss of vision, blurred vision or speech difficulties. Pt given care advice and pt has appt tomorrow @ 4:20 with  Dr. Creta LevinStallings. Pt advised to keep appt. Reason for Disposition . Systolic BP  >= 160 OR Diastolic >= 100  Answer Assessment - Initial Assessment Questions 1. BLOOD PRESSURE: "What is the blood pressure?" "Did you take at least two measurements 5 minutes apart?"     179/91 and 178/96 2. ONSET: "When did you take your blood pressure?"     5:15 and 5:20 3. HOW: "How did you obtain the blood pressure?" (e.g., visiting nurse, automatic home BP monitor)    Automatic BP monitor 4. HISTORY: "Do you have a history of high blood pressure?"     yes 5. MEDICATIONS: "Are you taking any medications for blood pressure?" "Have you missed any doses recently?"     Is not taking BP meds Lisinopril stopped and started HCTZ last night 6. OTHER SYMPTOMS: "Do you have any symptoms?" (e.g., headache, chest pain, blurred vision, difficulty breathing, weakness)     Headache 2-3 out of 10.   No blurred vision or spots before eyes, no weakness 7. PREGNANCY: "Is there any chance you are pregnant?" "When was your last menstrual period?"     N/a  Protocols used: HIGH BLOOD PRESSURE-A-AH

## 2017-01-16 ENCOUNTER — Other Ambulatory Visit: Payer: Self-pay

## 2017-01-16 ENCOUNTER — Encounter: Payer: Self-pay | Admitting: Family Medicine

## 2017-01-16 ENCOUNTER — Ambulatory Visit: Payer: BLUE CROSS/BLUE SHIELD | Admitting: Family Medicine

## 2017-01-16 VITALS — BP 146/102 | HR 79 | Temp 99.1°F | Resp 17 | Ht 70.0 in | Wt 300.0 lb

## 2017-01-16 DIAGNOSIS — M79602 Pain in left arm: Secondary | ICD-10-CM | POA: Diagnosis not present

## 2017-01-16 DIAGNOSIS — T148XXA Other injury of unspecified body region, initial encounter: Secondary | ICD-10-CM | POA: Diagnosis not present

## 2017-01-16 DIAGNOSIS — I10 Essential (primary) hypertension: Secondary | ICD-10-CM

## 2017-01-16 MED ORDER — HYDROCHLOROTHIAZIDE 25 MG PO TABS: 25.0000 mg | ORAL_TABLET | Freq: Every day | ORAL | 1 refills | Status: DC

## 2017-01-16 MED ORDER — DICLOFENAC SODIUM 1 % TD GEL: 2.0000 g | Freq: Four times a day (QID) | TRANSDERMAL | 0 refills | Status: DC

## 2017-01-16 NOTE — Patient Instructions (Addendum)
   IF you received an x-ray today, you will receive an invoice from [facility]. Please contact [facility] at [phone] with questions or concerns regarding your invoice.   IF you received labwork today, you will receive an invoice from [facility]. Please contact [facility] at [phone] with questions or concerns regarding your invoice.   Our billing staff will not be able to assist you with questions regarding bills from these companies.  You will be contacted with the lab results as soon as they are available. The fastest way to get your results is to activate your My Chart account. Instructions are located on the last page of this paperwork. If you have not heard from us regarding the results in 2 weeks, please contact this office.      DASH Eating Plan DASH stands for "Dietary Approaches to Stop Hypertension." The DASH eating plan is a healthy eating plan that has been shown to reduce high blood pressure (hypertension). It may also reduce your risk for type 2 diabetes, heart disease, and stroke. The DASH eating plan may also help with weight loss. What are tips for following this plan? General guidelines  Avoid eating more than 2,300 mg (milligrams) of salt (sodium) a day. If you have hypertension, you may need to reduce your sodium intake to 1,500 mg a day.  Limit alcohol intake to no more than 1 drink a day for nonpregnant women and 2 drinks a day for men. One drink equals 12 oz of beer, 5 oz of wine, or 1 oz of hard liquor.  Work with your health care provider to maintain a healthy body weight or to lose weight. Ask what an ideal weight is for you.  Get at least 30 minutes of exercise that causes your heart to beat faster (aerobic exercise) most days of the week. Activities may include walking, swimming, or biking.  Work with your health care provider or diet and nutrition specialist (dietitian) to adjust your eating plan to your individual calorie  needs. Reading food labels  Check food labels for the amount of sodium per serving. Choose foods with less than 5 percent of the Daily Value of sodium. Generally, foods with less than 300 mg of sodium per serving fit into this eating plan.  To find whole grains, look for the word "whole" as the first word in the ingredient list. Shopping  Buy products labeled as "low-sodium" or "no salt added."  Buy fresh foods. Avoid canned foods and premade or frozen meals. Cooking  Avoid adding salt when cooking. Use salt-free seasonings or herbs instead of table salt or sea salt. Check with your health care provider or pharmacist before using salt substitutes.  Do not fry foods. Cook foods using healthy methods such as baking, boiling, grilling, and broiling instead.  Cook with heart-healthy oils, such as olive, canola, soybean, or sunflower oil. Meal planning   Eat a balanced diet that includes: ? 5 or more servings of fruits and vegetables each day. At each meal, try to fill half of your plate with fruits and vegetables. ? Up to 6-8 servings of whole grains each day. ? Less than 6 oz of lean meat, poultry, or fish each day. A 3-oz serving of meat is about the same size as a deck of cards. One egg equals 1 oz. ? 2 servings of low-fat dairy each day. ? A serving of nuts, seeds, or beans 5 times each week. ? Heart-healthy fats. Healthy fats called Omega-3 fatty acids are   found in foods such as flaxseeds and coldwater fish, like sardines, salmon, and mackerel.  Limit how much you eat of the following: ? Canned or prepackaged foods. ? Food that is high in trans fat, such as fried foods. ? Food that is high in saturated fat, such as fatty meat. ? Sweets, desserts, sugary drinks, and other foods with added sugar. ? Full-fat dairy products.  Do not salt foods before eating.  Try to eat at least 2 vegetarian meals each week.  Eat more home-cooked food and less restaurant, buffet, and fast  food.  When eating at a restaurant, ask that your food be prepared with less salt or no salt, if possible. What foods are recommended? The items listed may not be a complete list. Talk with your dietitian about what dietary choices are best for you. Grains Whole-grain or whole-wheat bread. Whole-grain or whole-wheat pasta. Brown rice. Oatmeal. Quinoa. Bulgur. Whole-grain and low-sodium cereals. Pita bread. Low-fat, low-sodium crackers. Whole-wheat flour tortillas. Vegetables Fresh or frozen vegetables (raw, steamed, roasted, or grilled). Low-sodium or reduced-sodium tomato and vegetable juice. Low-sodium or reduced-sodium tomato sauce and tomato paste. Low-sodium or reduced-sodium canned vegetables. Fruits All fresh, dried, or frozen fruit. Canned fruit in natural juice (without added sugar). Meat and other protein foods Skinless chicken or turkey. Ground chicken or turkey. Pork with fat trimmed off. Fish and seafood. Egg whites. Dried beans, peas, or lentils. Unsalted nuts, nut butters, and seeds. Unsalted canned beans. Lean cuts of beef with fat trimmed off. Low-sodium, lean deli meat. Dairy Low-fat (1%) or fat-free (skim) milk. Fat-free, low-fat, or reduced-fat cheeses. Nonfat, low-sodium ricotta or cottage cheese. Low-fat or nonfat yogurt. Low-fat, low-sodium cheese. Fats and oils Soft margarine without trans fats. Vegetable oil. Low-fat, reduced-fat, or light mayonnaise and salad dressings (reduced-sodium). Canola, safflower, olive, soybean, and sunflower oils. Avocado. Seasoning and other foods Herbs. Spices. Seasoning mixes without salt. Unsalted popcorn and pretzels. Fat-free sweets. What foods are not recommended? The items listed may not be a complete list. Talk with your dietitian about what dietary choices are best for you. Grains Baked goods made with fat, such as croissants, muffins, or some breads. Dry pasta or rice meal packs. Vegetables Creamed or fried vegetables. Vegetables  in a cheese sauce. Regular canned vegetables (not low-sodium or reduced-sodium). Regular canned tomato sauce and paste (not low-sodium or reduced-sodium). Regular tomato and vegetable juice (not low-sodium or reduced-sodium). Pickles. Olives. Fruits Canned fruit in a light or heavy syrup. Fried fruit. Fruit in cream or butter sauce. Meat and other protein foods Fatty cuts of meat. Ribs. Fried meat. Bacon. Sausage. Bologna and other processed lunch meats. Salami. Fatback. Hotdogs. Bratwurst. Salted nuts and seeds. Canned beans with added salt. Canned or smoked fish. Whole eggs or egg yolks. Chicken or turkey with skin. Dairy Whole or 2% milk, cream, and half-and-half. Whole or full-fat cream cheese. Whole-fat or sweetened yogurt. Full-fat cheese. Nondairy creamers. Whipped toppings. Processed cheese and cheese spreads. Fats and oils Butter. Stick margarine. Lard. Shortening. Ghee. Bacon fat. Tropical oils, such as coconut, palm kernel, or palm oil. Seasoning and other foods Salted popcorn and pretzels. Onion salt, garlic salt, seasoned salt, table salt, and sea salt. Worcestershire sauce. Tartar sauce. Barbecue sauce. Teriyaki sauce. Soy sauce, including reduced-sodium. Steak sauce. Canned and packaged gravies. Fish sauce. Oyster sauce. Cocktail sauce. Horseradish that you find on the shelf. Ketchup. Mustard. Meat flavorings and tenderizers. Bouillon cubes. Hot sauce and Tabasco sauce. Premade or packaged marinades. Premade or packaged taco seasonings.   Relishes. Regular salad dressings. Where to find more information:  National Heart, Lung, and Blood Institute: www.nhlbi.nih.gov  American Heart Association: www.heart.org Summary  The DASH eating plan is a healthy eating plan that has been shown to reduce high blood pressure (hypertension). It may also reduce your risk for type 2 diabetes, heart disease, and stroke.  With the DASH eating plan, you should limit salt (sodium) intake to 2,300 mg a  day. If you have hypertension, you may need to reduce your sodium intake to 1,500 mg a day.  When on the DASH eating plan, aim to eat more fresh fruits and vegetables, whole grains, lean proteins, low-fat dairy, and heart-healthy fats.  Work with your health care provider or diet and nutrition specialist (dietitian) to adjust your eating plan to your individual calorie needs. This information is not intended to replace advice given to you by your health care provider. Make sure you discuss any questions you have with your health care provider. Document Released: 01/03/2011 Document Revised: 01/08/2016 Document Reviewed: 01/08/2016 Elsevier Interactive Patient Education  2018 Elsevier Inc.  

## 2017-01-16 NOTE — Telephone Encounter (Signed)
Pt has appt today.  Made entry on Notes: BP high - see CRM

## 2017-01-16 NOTE — Progress Notes (Signed)
Chief Complaint  Patient presents with  . blood pressure high and medication check    seen in UC for left shoulder pain at UC and prescribed the lisinoprl, per pt robaxin caused bp to increase and ha's.  BP last night of 179/104 and rechecked a second time and down to 157/104.  The pain is a throbbing pain that starts in the left shoulder and radiates into mid arm muscles.  Prednisone given for 5 days and didn't help with pain.    HPI   Pt reports that his blood pressure at home was 179/104 at home overnight On recheck his bp was 157/104 He went to the UC for shoulder pain at [facility] his bp was 152/100 on 01/09/17 and was started on lisinopril  He states that he [facility] and his bp was 185/109 so he was advised to stop lisinopril and resume HCTZ at 25mg  He was also prescribed tramadol and robaxin for shoulder strain and noted headache and throbbing pain down his left arm that started after having some neck strain. The neck strain was related to his previous muscle strain and "crick in the neck". BP Readings from Last 3 Encounters:  01/16/17 (!) 146/102  01/14/17 (!) 185/109  09/24/16 (!) 147/91      Past Medical History:  Diagnosis Date  . Allergy     Current Outpatient Medications  Medication Sig Dispense Refill  . MULTIPLE VITAMINS PO Take by mouth.    . diclofenac sodium (VOLTAREN) 1 % GEL Apply 2 g topically 4 (four) times daily. 100 g 0  . hydrochlorothiazide (HYDRODIURIL) 25 MG tablet Take 1 tablet (25 mg total) by mouth daily. 90 tablet 1   No current facility-administered medications for this visit.     Allergies: No Known Allergies  Past Surgical History:  Procedure Laterality Date  . KIDNEY DONATION Left 2005   donation to sister    Social History   Socioeconomic History  . Marital status: Single    Spouse name: n/a  . Number of children: 6  . Years of education: 1614  . Highest education level: None  Social Needs  . Financial  resource strain: None  . Food insecurity - worry: None  . Food insecurity - inability: None  . Transportation needs - medical: None  . Transportation needs - non-medical: None  Occupational History  . 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 Smoking status: Never Smoker  . Smokeless tobacco: Never Used  Substance and Sexual Activity  . Alcohol use: No  . Drug use: No  . Sexual activity: None  Other Topics Concern  . None  Social History Narrative   Lives alone. Sees his children daily.    Family History  Problem Relation Age of Onset  . Diabetes Mother   . Kidney disease Sister        s/p kidney transplant from this patient     ROS Review of Systems See HPI Constitution: No fevers or chills No malaise No diaphoresis Skin: No rash or itching Eyes: no blurry vision, no double vision GU: no dysuria or hematuria Neuro: no dizziness or headaches * all others reviewed and negative   Objective: Vitals:   01/16/17 1701  BP: (!) 146/102  Pulse: 79  Resp: 17  Temp: 99.1 F (37.3 C)  TempSrc: Oral  SpO2: 98%  Weight: 300 lb (136.1 kg)  Height: 5\' 10"  (1.778 m)   Wt Readings from Last 3 Encounters:  01/16/17 300 lb (136.1 kg)  09/24/16 (!) 304 lb 12.8 oz (138.3 kg)  08/20/16 (!) 300 lb 9.6 oz (136.4 kg)   Body mass index is 43.05 kg/m.  Physical Exam  Constitutional: He is oriented to person, place, and time. He appears well-developed and well-nourished.  HENT:  Head: Normocephalic and atraumatic.  Eyes: Conjunctivae and EOM are normal.  Cardiovascular: Normal rate, regular rhythm and normal heart sounds.  No murmur heard. Pulmonary/Chest: Effort normal and breath sounds normal. No stridor. No respiratory distress.  Neurological: He is alert and oriented to person, place, and time.  Skin: Skin is warm. Capillary refill takes less than 2 seconds.  Psychiatric: He has a normal mood and affect. His behavior is normal. Judgment and thought content normal.      ECG 01/09/17 nsr ECG 01/16/17 nsr, no st segment elevation, no twi    Assessment and Plan Fred ModenaJeremiah was seen today for blood pressure high and medication check.  Diagnoses and all orders for this visit:  Uncontrolled hypertension- bp not currently controlled  Advised hctz 25mg  Discussed DASH diet Common side effects -     EKG 12-Lead  Left arm pain- noncardiac origin -     EKG 12-Lead  Muscle strain- related  -     diclofenac sodium (VOLTAREN) 1 % GEL; Apply 2 g topically 4 (four) times daily.  Other orders -     hydrochlorothiazide (HYDRODIURIL) 25 MG tablet; Take 1 tablet (25 mg total) by mouth daily.  Morbid obesity-  Discussed goal of 10 pound weight loss   A total of 25 minutes were spent face-to-face with the patient during this encounter and over half of that time was spent on counseling and coordination of care.   Laquia Rosano A Kenslie Abbruzzese

## 2017-02-06 ENCOUNTER — Ambulatory Visit: Payer: BLUE CROSS/BLUE SHIELD | Admitting: Family Medicine

## 2017-02-06 NOTE — Progress Notes (Deleted)
  No chief complaint on file.   HPI  4 review of systems  Past Medical History:  Diagnosis Date  . Allergy     Current Outpatient Medications  Medication Sig Dispense Refill  . diclofenac sodium (VOLTAREN) 1 % GEL Apply 2 g topically 4 (four) times daily. 100 g 0  . hydrochlorothiazide (HYDRODIURIL) 25 MG tablet Take 1 tablet (25 mg total) by mouth daily. 90 tablet 1  . MULTIPLE VITAMINS PO Take by mouth.     No current facility-administered medications for this visit.     Allergies: No Known Allergies  Past Surgical History:  Procedure Laterality Date  . KIDNEY DONATION Left 2005   donation to sister    Social History   Socioeconomic History  . Marital status: Single    Spouse name: n/a  . Number of children: 6  . Years of education: 4214  . Highest education level: Not on file  Social Needs  . Financial resource strain: Not on file  . Food insecurity - worry: Not on file  . Food insecurity - inability: Not on file  . Transportation needs - medical: Not on file  . Transportation needs - non-medical: Not on file  Occupational History  . 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 Smoking status: Never Smoker  . Smokeless tobacco: Never Used  Substance and Sexual Activity  . Alcohol use: No  . Drug use: No  . Sexual activity: Not on file  Other Topics Concern  . Not on file  Social History Narrative   Lives alone. Sees his children daily.    Family History  Problem Relation Age of Onset  . Diabetes Mother   . Kidney disease Sister        s/p kidney transplant from this patient     ROS Review of Systems See HPI Constitution: No fevers or chills No malaise No diaphoresis Skin: No rash or itching Eyes: no blurry vision, no double vision GU: no dysuria or hematuria Neuro: no dizziness or headaches * all others reviewed and negative   Objective: There were no vitals filed for this visit.  Physical Exam  Assessment and Plan There are no  diagnoses linked to this encounter.   Chrishauna Mee P [employer]

## 2017-02-07 DIAGNOSIS — G4733 Obstructive sleep apnea (adult) (pediatric): Secondary | ICD-10-CM | POA: Diagnosis not present

## 2017-02-10 ENCOUNTER — Ambulatory Visit: Payer: BLUE CROSS/BLUE SHIELD | Admitting: Family Medicine

## 2017-02-10 NOTE — Progress Notes (Deleted)
  No chief complaint on file.   HPI  4 review of systems  Past Medical History:  Diagnosis Date  . Allergy     Current Outpatient Medications  Medication Sig Dispense Refill  . diclofenac sodium (VOLTAREN) 1 % GEL Apply 2 g topically 4 (four) times daily. 100 g 0  . hydrochlorothiazide (HYDRODIURIL) 25 MG tablet Take 1 tablet (25 mg total) by mouth daily. 90 tablet 1  . MULTIPLE VITAMINS PO Take by mouth.     No current facility-administered medications for this visit.     Allergies: No Known Allergies  Past Surgical History:  Procedure Laterality Date  . KIDNEY DONATION Left 2005   donation to sister    Social History   Socioeconomic History  . Marital status: Single    Spouse name: n/a  . Number of children: 6  . Years of education: 14  . Highest education level: Not on file  Social Needs  . Financial resource strain: Not on file  . Food insecurity - worry: Not on file  . Food insecurity - inability: Not on file  . Transportation needs - medical: Not on file  . Transportation needs - non-medical: Not on file  Occupational History  . Occupation: truck driver  Tobacco Use  . Smoking status: Never Smoker  . Smokeless tobacco: Never Used  Substance and Sexual Activity  . Alcohol use: No  . Drug use: No  . Sexual activity: Not on file  Other Topics Concern  . Not on file  Social History Narrative   Lives alone. Sees his children daily.    Family History  Problem Relation Age of Onset  . Diabetes Mother   . Kidney disease Sister        s/p kidney transplant from this patient     ROS Review of Systems See HPI Constitution: No fevers or chills No malaise No diaphoresis Skin: No rash or itching Eyes: no blurry vision, no double vision GU: no dysuria or hematuria Neuro: no dizziness or headaches * all others reviewed and negative   Objective: There were no vitals filed for this visit.  Physical Exam  Assessment and Plan There are no  diagnoses linked to this encounter.   Todd Jelinski P Coy Vandoren 

## 2017-02-24 ENCOUNTER — Ambulatory Visit: Payer: BLUE CROSS/BLUE SHIELD | Admitting: Family Medicine

## 2017-02-24 NOTE — Progress Notes (Deleted)
  No chief complaint on file.   HPI  4 review of systems  Past Medical History:  Diagnosis Date  . Allergy     Current Outpatient Medications  Medication Sig Dispense Refill  . diclofenac sodium (VOLTAREN) 1 % GEL Apply 2 g topically 4 (four) times daily. 100 g 0  . hydrochlorothiazide (HYDRODIURIL) 25 MG tablet Take 1 tablet (25 mg total) by mouth daily. 90 tablet 1  . MULTIPLE VITAMINS PO Take by mouth.     No current facility-administered medications for this visit.     Allergies: No Known Allergies  Past Surgical History:  Procedure Laterality Date  . KIDNEY DONATION Left 2005   donation to sister    Social History   Socioeconomic History  . Marital status: Single    Spouse name: n/a  . Number of children: 6  . Years of education: 14  . Highest education level: Not on file  Social Needs  . Financial resource strain: Not on file  . Food insecurity - worry: Not on file  . Food insecurity - inability: Not on file  . Transportation needs - medical: Not on file  . Transportation needs - non-medical: Not on file  Occupational History  . Occupation: truck driver  Tobacco Use  . Smoking status: Never Smoker  . Smokeless tobacco: Never Used  Substance and Sexual Activity  . Alcohol use: No  . Drug use: No  . Sexual activity: Not on file  Other Topics Concern  . Not on file  Social History Narrative   Lives alone. Sees his children daily.    Family History  Problem Relation Age of Onset  . Diabetes Mother   . Kidney disease Sister        s/p kidney transplant from this patient     ROS Review of Systems See HPI Constitution: No fevers or chills No malaise No diaphoresis Skin: No rash or itching Eyes: no blurry vision, no double vision GU: no dysuria or hematuria Neuro: no dizziness or headaches * all others reviewed and negative   Objective: There were no vitals filed for this visit.  Physical Exam  Assessment and Plan There are no  diagnoses linked to this encounter.   Delores P Gaddy 

## 2017-03-10 DIAGNOSIS — G4733 Obstructive sleep apnea (adult) (pediatric): Secondary | ICD-10-CM | POA: Diagnosis not present

## 2017-04-10 ENCOUNTER — Other Ambulatory Visit: Payer: Self-pay

## 2017-04-10 ENCOUNTER — Encounter: Payer: Self-pay | Admitting: Family Medicine

## 2017-04-10 ENCOUNTER — Ambulatory Visit (INDEPENDENT_AMBULATORY_CARE_PROVIDER_SITE_OTHER): Payer: BLUE CROSS/BLUE SHIELD | Admitting: Family Medicine

## 2017-04-10 VITALS — BP 138/98 | HR 78 | Temp 98.9°F | Resp 12 | Ht 70.87 in | Wt 299.6 lb

## 2017-04-10 DIAGNOSIS — R7303 Prediabetes: Secondary | ICD-10-CM | POA: Diagnosis not present

## 2017-04-10 DIAGNOSIS — I10 Essential (primary) hypertension: Secondary | ICD-10-CM

## 2017-04-10 DIAGNOSIS — N5201 Erectile dysfunction due to arterial insufficiency: Secondary | ICD-10-CM

## 2017-04-10 DIAGNOSIS — N183 Chronic kidney disease, stage 3 unspecified: Secondary | ICD-10-CM

## 2017-04-10 MED ORDER — TADALAFIL 5 MG PO TABS: 5.0000 mg | ORAL_TABLET | Freq: Every day | ORAL | 0 refills | Status: DC | PRN

## 2017-04-10 NOTE — Patient Instructions (Addendum)
   IF you received an x-ray today, you will receive an invoice from [facility]. Please contact [facility] at [phone] with questions or concerns regarding your invoice.   IF you received labwork today, you will receive an invoice from [facility]. Please contact [facility] at [phone] with questions or concerns regarding your invoice.   Our billing staff will not be able to assist you with questions regarding bills from these companies.  You will be contacted with the lab results as soon as they are available. The fastest way to get your results is to activate your My Chart account. Instructions are located on the last page of this paperwork. If you have not heard from us regarding the results in 2 weeks, please contact this office.     Erectile Dysfunction Erectile dysfunction (ED) is the inability to get or keep an erection in order to have sexual intercourse. Erectile dysfunction may include:  Inability to get an erection.  Lack of enough hardness of the erection to allow penetration.  Loss of the erection before sex is finished.  What are the causes? This condition may be caused by:  Certain medicines, such as: ? Pain relievers. ? Antihistamines. ? Antidepressants. ? Blood pressure medicines. ? Water pills (diuretics). ? Ulcer medicines. ? Muscle relaxants. ? Drugs.  Excessive drinking.  Psychological causes, such as: ? Anxiety. ? Depression. ? Sadness. ? Exhaustion. ? Performance fear. ? Stress.  Physical causes, such as: ? Artery problems. This may include diabetes, smoking, liver disease, or atherosclerosis. ? High blood pressure. ? Hormonal problems, such as low testosterone. ? Obesity. ? Nerve problems. This may include back or pelvic injuries, diabetes mellitus, multiple sclerosis, or Parkinson disease.  What are the signs or symptoms? Symptoms of this condition include:  Inability to get an erection.  Lack of enough hardness  of the erection to allow penetration.  Loss of the erection before sex is finished.  Normal erections at some times, but with frequent unsatisfactory episodes.  Low sexual satisfaction in either partner due to erection problems.  A curved penis occurring with erection. The curve may cause pain or the penis may be too curved to allow for intercourse.  Never having nighttime erections.  How is this diagnosed? This condition is often diagnosed by:  Performing a physical exam to find other diseases or specific problems with the penis.  Asking you detailed questions about the problem.  Performing blood tests to check for diabetes mellitus or to measure hormone levels.  Performing other tests to check for underlying health conditions.  Performing an ultrasound exam to check for scarring.  Performing a test to check blood flow to the penis.  Doing a sleep study at home to measure nighttime erections.  How is this treated? This condition may be treated by:  Medicine taken by mouth to help you achieve an erection (oral medicine).  Hormone replacement therapy to replace low testosterone levels.  Medicine that is injected into the penis. Your health care provider may instruct you how to give yourself these injections at home.  Vacuum pump. This is a pump with a ring on it. The pump and ring are placed on the penis and used to create pressure that helps the penis become erect.  Penile implant surgery. In this procedure, you may receive: ? An inflatable implant. This consists of cylinders, a pump, and a reservoir. The cylinders can be inflated with a fluid that helps to create an erection, and they can be deflated   after intercourse. ? A semi-rigid implant. This consists of two silicone rubber rods. The rods provide some rigidity. They are also flexible, so the penis can both curve downward in its normal position and become straight for sexual intercourse.  Blood vessel surgery, to  improve blood flow to the penis. During this procedure, a blood vessel from a different part of the body is placed into the penis to allow blood to flow around (bypass) damaged or blocked blood vessels.  Lifestyle changes, such as exercising more, losing weight, and quitting smoking.  Follow these instructions at home: Medicines  Take over-the-counter and prescription medicines only as told by your health care provider. Do not increase the dosage without first discussing it with your health care provider.  If you are using self-injections, perform injections as directed by your health care provider. Make sure to avoid any veins that are on the surface of the penis. After giving an injection, apply pressure to the injection site for 5 minutes. General instructions  Exercise regularly, as directed by your health care provider. Work with your health care provider to lose weight, if needed.  Do not use any products that contain nicotine or tobacco, such as cigarettes and e-cigarettes. If you need help quitting, ask your health care provider.  Before using a vacuum pump, read the instructions that come with the pump and discuss any questions with your health care provider.  Keep all follow-up visits as told by your health care provider. This is important. Contact a health care provider if:  You feel nauseous.  You vomit. Get help right away if:  You are taking oral or injectable medicines and you have an erection that lasts longer than 4 hours. If your health care provider is unavailable, go to the nearest emergency room for evaluation. An erection that lasts much longer than 4 hours can result in permanent damage to your penis.  You have severe pain in your groin or abdomen.  You develop redness or severe swelling of your penis.  You have redness spreading up into your groin or lower abdomen.  You are unable to urinate.  You experience chest pain or a rapid heart beat (palpitations)  after taking oral medicines. Summary  Erectile dysfunction (ED) is the inability to get or keep an erection during sexual intercourse. This problem can usually be treated successfully.  This condition is diagnosed based on a physical exam, your symptoms, and tests to determine the cause. Treatment varies depending on the cause, and may include medicines, hormone therapy, surgery, or vacuum pump.  You may need follow-up visits to make sure that you are using your medicines or devices correctly.  Get help right away if you are taking or injecting medicines and you have an erection that lasts longer than 4 hours. This information is not intended to replace advice given to you by your health care provider. Make sure you discuss any questions you have with your health care provider. Document Released: 01/12/2000 Document Revised: 01/31/2016 Document Reviewed: 01/31/2016 Elsevier Interactive Patient Education  2017 Elsevier Inc.  

## 2017-04-10 NOTE — Progress Notes (Signed)
Chief Complaint  Patient presents with  . Uncontrolled Hypertension    follow up    HPI   Hypertension: Patient here for follow-up of elevated blood pressure. He is not exercising regularly and is adherent to low salt diet.  Blood pressure is well controlled at home. Cardiac symptoms none. Patient denies chest pain, chest pressure/discomfort, claudication, dyspnea, exertional chest pressure/discomfort, irregular heart beat, lower extremity edema, near-syncope, orthopnea and palpitations.  Cardiovascular risk factors: hypertension, male gender, obesity (BMI >= 30 kg/m2) and sedentary lifestyle. Use of agents associated with hypertension: none. History of target organ damage: none.  He takes an otc potassium supplement when he gets muscle cramps.  BP Readings from Last 3 Encounters:  04/10/17 (!) 138/98  01/16/17 (!) 146/102  01/14/17 (!) 185/109    Lab Results  Component Value Date   CREATININE 1.65 (H) 09/24/2016    Erectile Dysfunction Pt reports that sometimes achieving and maintaining erections has been difficult due to taking his bp meds He plans to start exercising He was previously lifting weight but stopped because he was bulking up He plans to increase his Cardio  Obesity Body mass index is 41.94 kg/m. Wt Readings from Last 3 Encounters:  04/10/17 299 lb 9.6 oz (135.9 kg)  01/16/17 300 lb (136.1 kg)  09/24/16 (!) 304 lb 12.8 oz (138.3 kg)   He cut back on sweets and breads He is working on weight loss to improve his overall health He is eating more fish He cut back on fried foods and sugary drinks    Past Medical History:  Diagnosis Date  . Allergy     Current Outpatient Medications  Medication Sig Dispense Refill  . diclofenac sodium (VOLTAREN) 1 % GEL Apply 2 g topically 4 (four) times daily. 100 g 0  . hydrochlorothiazide (HYDRODIURIL) 25 MG tablet Take 1 tablet (25 mg total) by mouth daily. 90 tablet 1  . MULTIPLE VITAMINS PO Take by mouth.    .  tadalafil (CIALIS) 5 MG tablet Take 1 tablet (5 mg total) by mouth daily as needed for erectile dysfunction. 5 tablet 0   No current facility-administered medications for this visit.     Allergies: No Known Allergies  Past Surgical History:  Procedure Laterality Date  . KIDNEY DONATION Left 2005   donation to sister    Social History   Socioeconomic History  . Marital status: Single    Spouse name: n/a  . Number of children: 6  . Years of education: 4314  . Highest education level: None  Social Needs  . Financial resource strain: None  . Food insecurity - worry: None  . Food insecurity - inability: None  . Transportation needs - medical: None  . Transportation needs - non-medical: None  Occupational History  . 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 Smoking status: Never Smoker  . Smokeless tobacco: Never Used  Substance and Sexual Activity  . Alcohol use: No  . Drug use: No  . Sexual activity: None  Other Topics Concern  . None  Social History Narrative   Lives alone. Sees his children daily.    Family History  Problem Relation Age of Onset  . Diabetes Mother   . Kidney disease Sister        s/p kidney transplant from this patient     ROS Review of Systems See HPI Constitution: No fevers or chills No malaise No diaphoresis Skin: No rash or itching Eyes: no blurry vision, no double vision GU:  no dysuria or hematuria Neuro: no dizziness or headaches  all others reviewed and negative   Objective: Vitals:   04/10/17 1525  BP: (!) 138/98  Pulse: 78  Resp: 12  Temp: 98.9 F (37.2 C)  SpO2: 95%  Weight: 299 lb 9.6 oz (135.9 kg)  Height: 5' 10.87" (1.8 m)    Physical Exam Physical Exam  Constitutional: She is oriented to person, place, and time. She appears well-developed and well-nourished.  HENT:  Head: Normocephalic and atraumatic.  Eyes: Conjunctivae and EOM are normal.  Cardiovascular: Normal rate, regular rhythm and normal heart sounds.     Pulmonary/Chest: Effort normal and breath sounds normal. No respiratory distress. She has no wheezes.  Abdominal: Normal appearance and bowel sounds are normal. There is no tenderness. There is no CVA tenderness.  Neurological: She is alert and oriented to person, place, and time.    Assessment and Plan Kee was seen today for uncontrolled hypertension.  Diagnoses and all orders for this visit:  Prediabetes- will check a1c Discussed that he may need metformin He is resistant to taking meds -     Comprehensive metabolic panel -     Hemoglobin A1c  Essential hypertension- bp improved with hctz Continue weight loss and add exercise DASH diet -     Comprehensive metabolic panel -     Lipid panel  Morbid obesity (HCC)- improved with dietary modifications   Erectile dysfunction due to arterial insufficiency- pt requesting help Discussed viagra vs. cialis Pt agreeable to trial of cialis  Other orders -     tadalafil (CIALIS) 5 MG tablet; Take 1 tablet (5 mg total) by mouth daily as needed for erectile dysfunction.     Zoe A Stallings

## 2017-04-11 ENCOUNTER — Other Ambulatory Visit: Payer: Self-pay | Admitting: Family Medicine

## 2017-04-11 LAB — COMPREHENSIVE METABOLIC PANEL
A/G RATIO: 1.6 (ref 1.2–2.2)
ALK PHOS: 76 IU/L (ref 39–117)
ALT: 59 IU/L — AB (ref 0–44)
AST: 48 IU/L — AB (ref 0–40)
Albumin: 4.7 g/dL (ref 3.5–5.5)
BILIRUBIN TOTAL: 0.5 mg/dL (ref 0.0–1.2)
BUN/Creatinine Ratio: 13 (ref 9–20)
BUN: 23 mg/dL (ref 6–24)
CHLORIDE: 98 mmol/L (ref 96–106)
CO2: 26 mmol/L (ref 20–29)
Calcium: 9.7 mg/dL (ref 8.7–10.2)
Creatinine, Ser: 1.71 mg/dL — ABNORMAL HIGH (ref 0.76–1.27)
GFR calc Af Amer: 56 mL/min/{1.73_m2} — ABNORMAL LOW (ref >59)
GFR, EST NON AFRICAN AMERICAN: 49 mL/min/{1.73_m2} — AB (ref >59)
Globulin, Total: 3 g/dL (ref 1.5–4.5)
Glucose: 92 mg/dL (ref 65–99)
POTASSIUM: 4.4 mmol/L (ref 3.5–5.2)
Sodium: 140 mmol/L (ref 134–144)
Total Protein: 7.7 g/dL (ref 6.0–8.5)

## 2017-04-11 LAB — LIPID PANEL
CHOLESTEROL TOTAL: 150 mg/dL (ref 100–199)
Chol/HDL Ratio: 5.2 ratio — ABNORMAL HIGH (ref 0.0–5.0)
HDL: 29 mg/dL — AB (ref >39)
LDL Calculated: 94 mg/dL (ref 0–99)
TRIGLYCERIDES: 136 mg/dL (ref 0–149)
VLDL Cholesterol Cal: 27 mg/dL (ref 5–40)

## 2017-04-11 LAB — HEMOGLOBIN A1C
Est. average glucose Bld gHb Est-mCnc: 134 mg/dL
Hgb A1c MFr Bld: 6.3 % — ABNORMAL HIGH (ref 4.8–5.6)

## 2017-04-11 MED ORDER — METFORMIN HCL 500 MG PO TABS: 500.0000 mg | ORAL_TABLET | Freq: Two times a day (BID) | ORAL | 3 refills | Status: DC

## 2017-04-11 MED ORDER — LISINOPRIL 20 MG PO TABS: 20.0000 mg | ORAL_TABLET | Freq: Every day | ORAL | 3 refills | Status: DC

## 2017-04-11 NOTE — Progress Notes (Signed)
Updated meds and created addendum with referral and orders

## 2017-04-11 NOTE — Addendum Note (Signed)
Addended by: Collie SiadSTALLINGS, Tymel Conely A on: 04/11/2017 04:19 AM   Modules accepted: Orders

## 2017-04-11 NOTE — Progress Notes (Signed)
Results shows increasing creatinine Lab Results  Component Value Date   CREATININE 1.71 (H) 04/10/2017   GFR is still >30 Will change bp med to lisinopril for renal protection and d/c hctz And refer to Nephrology for consultaton  Lab Results  Component Value Date   HGBA1C 6.3 (H) 04/10/2017   a1c increasing Will add metformin 500mg  daily His GFR is >30 so he is lower risk for lactic acidosis

## 2017-06-02 DIAGNOSIS — G4733 Obstructive sleep apnea (adult) (pediatric): Secondary | ICD-10-CM | POA: Diagnosis not present

## 2017-07-16 ENCOUNTER — Ambulatory Visit: Payer: BLUE CROSS/BLUE SHIELD | Admitting: Family Medicine

## 2017-07-16 NOTE — Progress Notes (Deleted)
  No chief complaint on file.   HPI  4 review of systems  Past Medical History:  Diagnosis Date  . Allergy     Current Outpatient Medications  Medication Sig Dispense Refill  . diclofenac sodium (VOLTAREN) 1 % GEL Apply 2 g topically 4 (four) times daily. 100 g 0  . lisinopril (PRINIVIL,ZESTRIL) 20 MG tablet Take 1 tablet (20 mg total) by mouth daily. 90 tablet 3  . metFORMIN (GLUCOPHAGE) 500 MG tablet Take 1 tablet (500 mg total) by mouth 2 (two) times daily with a meal. 180 tablet 3  . MULTIPLE VITAMINS PO Take by mouth.    . tadalafil (CIALIS) 5 MG tablet Take 1 tablet (5 mg total) by mouth daily as needed for erectile dysfunction. 5 tablet 0   No current facility-administered medications for this visit.     Allergies: No Known Allergies  Past Surgical History:  Procedure Laterality Date  . KIDNEY DONATION Left 2005   donation to sister    Social History   Socioeconomic History  . Marital status: Single    Spouse name: n/a  . Number of children: 6  . Years of education: 5014  . Highest education level: Not on file  Occupational History  . Occupation: truck Public librariandriver  Social Needs  . Financial resource strain: Not on file  . Food insecurity:    Worry: Not on file    Inability: Not on file  . Transportation needs:    Medical: Not on file    Non-medical: Not on file  Tobacco Use  . Smoking status: Never Smoker  . Smokeless tobacco: Never Used  Substance and Sexual Activity  . Alcohol use: No  . Drug use: No  . Sexual activity: Not on file  Lifestyle  . Physical activity:    Days per week: Not on file    Minutes per session: Not on file  . Stress: Not on file  Relationships  . Social connections:    Talks on phone: Not on file    Gets together: Not on file    Attends religious service: Not on file    Active member of club or organization: Not on file    Attends meetings of clubs or organizations: Not on file    Relationship status: Not on file  Other  Topics Concern  . Not on file  Social History Narrative   Lives alone. Sees his children daily.    Family History  Problem Relation Age of Onset  . Diabetes Mother   . Kidney disease Sister        s/p kidney transplant from this patient     ROS Review of Systems See HPI Constitution: No fevers or chills No malaise No diaphoresis Skin: No rash or itching Eyes: no blurry vision, no double vision GU: no dysuria or hematuria Neuro: no dizziness or headaches * all others reviewed and negative   Objective: There were no vitals filed for this visit.  Physical Exam  Assessment and Plan There are no diagnoses linked to this encounter.   Luma Clopper P [employer] Corporation[employer]

## 2017-07-28 ENCOUNTER — Other Ambulatory Visit: Payer: Self-pay

## 2017-07-28 ENCOUNTER — Ambulatory Visit (INDEPENDENT_AMBULATORY_CARE_PROVIDER_SITE_OTHER): Payer: BLUE CROSS/BLUE SHIELD | Admitting: Family Medicine

## 2017-07-28 VITALS — BP 138/86 | HR 77 | Temp 99.6°F | Resp 18 | Ht 70.87 in | Wt 297.8 lb

## 2017-07-28 DIAGNOSIS — R7303 Prediabetes: Secondary | ICD-10-CM

## 2017-07-28 DIAGNOSIS — I10 Essential (primary) hypertension: Secondary | ICD-10-CM

## 2017-07-28 DIAGNOSIS — N183 Chronic kidney disease, stage 3 unspecified: Secondary | ICD-10-CM

## 2017-07-28 LAB — POCT GLYCOSYLATED HEMOGLOBIN (HGB A1C): Hemoglobin A1C: 6.1 % — AB (ref 4.0–5.6)

## 2017-07-28 MED ORDER — TADALAFIL 5 MG PO TABS: 5.0000 mg | ORAL_TABLET | Freq: Every day | ORAL | 11 refills | Status: DC | PRN

## 2017-07-28 MED ORDER — DICLOFENAC SODIUM 1 % TD GEL: 2.0000 g | Freq: Four times a day (QID) | TRANSDERMAL | 0 refills | Status: DC

## 2017-07-28 MED ORDER — METFORMIN HCL 500 MG PO TABS: 500.0000 mg | ORAL_TABLET | Freq: Two times a day (BID) | ORAL | 3 refills | Status: DC

## 2017-07-28 NOTE — Patient Instructions (Addendum)
  Do not pick up the metformin today. If you kidney function is any worse then you will have to stop the metformin.  Continue what you are currently taking. I will call you with results tomorrow.    IF you received an x-ray today, you will receive an invoice from [facility]. Please contact [facility] at [phone] with questions or concerns regarding your invoice.   IF you received labwork today, you will receive an invoice from [facility]. Please contact [facility] at [phone] with questions or concerns regarding your invoice.   Our billing staff will not be able to assist you with questions regarding bills from these companies.  You will be contacted with the lab results as soon as they are available. The fastest way to get your results is to activate your My Chart account. Instructions are located on the last page of this paperwork. If you have not heard from us regarding the results in 2 weeks, please contact this office.

## 2017-07-28 NOTE — Progress Notes (Signed)
Chief Complaint  Patient presents with  . Hypertension    3 month f/u  . Medication Refill    diclofenac gel, lisinopril, tadalafil    HPI  Hypertension: Patient here for follow-up of elevated blood pressure. He is not exercising and is adherent to low salt diet.  Blood pressure not checked at home. Cardiac symptoms none. Patient denies chest pain, chest pressure/discomfort, claudication, dyspnea, exertional chest pressure/discomfort, fatigue, irregular heart beat and lower extremity edema.  Cardiovascular risk factors: dyslipidemia, hypertension and male gender. Use of agents associated with hypertension: none. History of target organ damage: chronic kidney disease. BP Readings from Last 3 Encounters:  07/28/17 138/86  04/10/17 (!) 138/98  01/16/17 (!) 146/102   Lab Results  Component Value Date   CREATININE 1.71 (H) 04/10/2017    CKD stage 3  He denies using nsaids He uses topical rubs and tylenol for muscle pain He drinks water all day to flush his kidneys Denies alcohol use  Lab Results  Component Value Date   CREATININE 1.71 (H) 04/10/2017    Obesity Pt reports that he has been working on his weight He was about 288# but has some fluctuations He states that he usually does a strict diet but then slips up on his eating He was walking daily but now the work has picked up and he is very busy  Wt Readings from Last 3 Encounters:  07/28/17 297 lb 12.8 oz (135.1 kg)  04/10/17 299 lb 9.6 oz (135.9 kg)  01/16/17 300 lb (136.1 kg)  Body mass index is 41.69 kg/m.  Prediabetes He reports that he eats a lot of potatoes  He eats them all kinds of ways He eats late at night and has not been able to exercise He likes baked potatoes Lab Results  Component Value Date   HGBA1C 6.1 (A) 07/28/2017     Past Medical History:  Diagnosis Date  . Allergy     Current Outpatient Medications  Medication Sig Dispense Refill  . diclofenac sodium (VOLTAREN) 1 % GEL Apply 2 g  topically 4 (four) times daily. 100 g 0  . lisinopril (PRINIVIL,ZESTRIL) 20 MG tablet Take 1 tablet (20 mg total) by mouth daily. 90 tablet 3  . metFORMIN (GLUCOPHAGE) 500 MG tablet Take 1 tablet (500 mg total) by mouth 2 (two) times daily with a meal. 180 tablet 3  . MULTIPLE VITAMINS PO Take by mouth.    . tadalafil (CIALIS) 5 MG tablet Take 1 tablet (5 mg total) by mouth daily as needed for erectile dysfunction. 5 tablet 0   No current facility-administered medications for this visit.     Allergies: No Known Allergies  Past Surgical History:  Procedure Laterality Date  . KIDNEY DONATION Left 2005   donation to sister    Social History   Socioeconomic History  . Marital status: Single    Spouse name: n/a  . Number of children: 6  . Years of education: 47  . Highest education level: Not on file  Occupational History  . Occupation: truck Public librarian Needs  . Financial resource strain: Not on file  . Food insecurity:    Worry: Not on file    Inability: Not on file  . Transportation needs:    Medical: Not on file    Non-medical: Not on file  Tobacco Use  . Smoking status: Never Smoker  . Smokeless tobacco: Never Used  Substance and Sexual Activity  . Alcohol use: No  . Drug use:  No  . Sexual activity: Not on file  Lifestyle  . Physical activity:    Days per week: Not on file    Minutes per session: Not on file  . Stress: Not on file  Relationships  . Social connections:    Talks on phone: Not on file    Gets together: Not on file    Attends religious service: Not on file    Active member of club or organization: Not on file    Attends meetings of clubs or organizations: Not on file    Relationship status: Not on file  Other Topics Concern  . Not on file  Social History Narrative   Lives alone. Sees his children daily.    Family History  Problem Relation Age of Onset  . Diabetes Mother   . Kidney disease Sister        s/p kidney transplant from this  patient     ROS Review of Systems See HPI Constitution: No fevers or chills No malaise No diaphoresis Skin: No rash or itching Eyes: no blurry vision, no double vision GU: no dysuria or hematuria Neuro: no dizziness or headaches all others reviewed and negative   Objective: Vitals:   07/28/17 1501  BP: 138/86  Pulse: 77  Resp: 18  Temp: 99.6 F (37.6 C)  TempSrc: Oral  SpO2: 98%  Weight: 297 lb 12.8 oz (135.1 kg)  Height: 5' 10.87" (1.8 m)     Physical Exam Physical Exam  Constitutional: he is oriented to person, place, and time. he appears well-developed and well-nourished.  HENT:  Head: Normocephalic and atraumatic.  Eyes: Conjunctivae and EOM are normal.  Cardiovascular: Normal rate, regular rhythm and normal heart sounds.   Pulmonary/Chest: Effort normal and breath sounds normal. No respiratory distress. he has no wheezes.  Abdominal: Normal appearance and bowel sounds are normal. There is no tenderness. There is no CVA tenderness.  Neurological: he is alert and oriented to person, place, and time.    Assessment and Plan Jeri ModenaJeremiah was seen today for hypertension and medication refill.  Diagnoses and all orders for this visit:  CKD (chronic kidney disease) stage 3, GFR 30-59 ml/min (HCC)- if levels increase will refer to Nephrology and have to stop metformin and lisinopril -     Comprehensive metabolic panel -     Microalbumin, urine  Morbid obesity (HCC)- improving Continue healthy lifestyle changes  Essential hypertension Patient's blood pressure is at goal of 139/89 or less. Condition is stable. Continue current medications and treatment plan.  If creatinine worsens will stop lisinopril I recommend that you exercise for 30-45 minutes 5 days a week. I also recommend a balanced diet with fruits and vegetables every day, lean meats, and little fried foods. The DASH diet (you can find this online) is a good example of this.  Prediabetes- improved on  metformin -     POCT glycosylated hemoglobin (Hb A1C)       Nyquan Selbe A Jevaun Strick

## 2017-07-29 ENCOUNTER — Encounter: Payer: Self-pay | Admitting: Family Medicine

## 2017-07-29 LAB — COMPREHENSIVE METABOLIC PANEL
ALBUMIN: 4 g/dL (ref 3.5–5.5)
ALK PHOS: 88 IU/L (ref 39–117)
ALT: 49 IU/L — ABNORMAL HIGH (ref 0–44)
AST: 36 IU/L (ref 0–40)
Albumin/Globulin Ratio: 1.3 (ref 1.2–2.2)
BILIRUBIN TOTAL: 0.4 mg/dL (ref 0.0–1.2)
BUN/Creatinine Ratio: 9 (ref 9–20)
BUN: 15 mg/dL (ref 6–24)
CHLORIDE: 104 mmol/L (ref 96–106)
CO2: 26 mmol/L (ref 20–29)
CREATININE: 1.59 mg/dL — AB (ref 0.76–1.27)
Calcium: 9.7 mg/dL (ref 8.7–10.2)
GFR calc Af Amer: 61 mL/min/{1.73_m2} (ref >59)
GFR calc non Af Amer: 53 mL/min/{1.73_m2} — ABNORMAL LOW (ref >59)
GLUCOSE: 86 mg/dL (ref 65–99)
Globulin, Total: 3.1 g/dL (ref 1.5–4.5)
Potassium: 4.8 mmol/L (ref 3.5–5.2)
SODIUM: 141 mmol/L (ref 134–144)
Total Protein: 7.1 g/dL (ref 6.0–8.5)

## 2017-07-29 LAB — MICROALBUMIN, URINE: MICROALBUM., U, RANDOM: 3.3 ug/mL

## 2017-07-29 MED ORDER — LISINOPRIL 20 MG PO TABS: 20.0000 mg | ORAL_TABLET | Freq: Every day | ORAL | 0 refills | Status: DC

## 2017-07-29 NOTE — Addendum Note (Signed)
Addended by: Collie SiadSTALLINGS, ZOE A on: 07/29/2017 08:29 AM   Modules accepted: Orders

## 2017-10-24 ENCOUNTER — Other Ambulatory Visit: Payer: Self-pay

## 2017-10-24 ENCOUNTER — Emergency Department (HOSPITAL_COMMUNITY)
Admission: EM | Admit: 2017-10-24 | Discharge: 2017-10-24 | Disposition: A | Discharge status: Home / Self Care | Payer: Worker's Compensation | Attending: Emergency Medicine | Admitting: Emergency Medicine

## 2017-10-24 ENCOUNTER — Encounter (HOSPITAL_COMMUNITY): Payer: Self-pay | Admitting: *Deleted

## 2017-10-24 DIAGNOSIS — M25512 Pain in left shoulder: Secondary | ICD-10-CM | POA: Insufficient documentation

## 2017-10-24 DIAGNOSIS — Z79899 Other long term (current) drug therapy: Secondary | ICD-10-CM | POA: Insufficient documentation

## 2017-10-24 DIAGNOSIS — M25622 Stiffness of left elbow, not elsewhere classified: Secondary | ICD-10-CM
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
## 2018-02-11 DIAGNOSIS — G4733 Obstructive sleep apnea (adult) (pediatric): Secondary | ICD-10-CM | POA: Diagnosis not present

## 2018-03-23 ENCOUNTER — Other Ambulatory Visit: Payer: Self-pay | Admitting: General Practice

## 2018-03-23 DIAGNOSIS — N183 Chronic kidney disease, stage 3 unspecified: Secondary | ICD-10-CM

## 2018-03-23 DIAGNOSIS — I10 Essential (primary) hypertension: Secondary | ICD-10-CM

## 2018-03-23 MED ORDER — LISINOPRIL 20 MG PO TABS: 20.0000 mg | ORAL_TABLET | Freq: Every day | ORAL | 0 refills | Status: DC

## 2018-03-23 MED ORDER — TADALAFIL 5 MG PO TABS: 5.0000 mg | ORAL_TABLET | Freq: Every day | ORAL | 11 refills | Status: DC | PRN

## 2018-03-23 MED ORDER — DICLOFENAC SODIUM 1 % TD GEL: 2.0000 g | Freq: Four times a day (QID) | TRANSDERMAL | 0 refills | Status: DC

## 2018-03-23 NOTE — Telephone Encounter (Signed)
Copied from CRM [id number]. Topic: Quick Communication - Rx Refill/Question >> Mar 23, 2018 11:54 AM [name] wrote: Medication: tadalafil (CIALIS) 5 MG tablet  diclofenac sodium (VOLTAREN) 1 % GEL  lisinopril (PRINIVIL,ZESTRIL) 20 MG tablet   Has the patient contacted their pharmacy? No. (Agent: If no, request that the patient contact the pharmacy for the refill.) Patient has no more refills on these three meds   Preferred Pharmacy (with phone number or street name): [facility] [phone] [place], [place] - [address] [phone] (Phone) [phone] (Fax)    Agent: Please be advised that RX refills may take up to 3 business days. We ask that you follow-up with your pharmacy.

## 2018-03-23 NOTE — Telephone Encounter (Signed)
Lisinopril 30 day courtesy refill    Pt has appt. 05/04/2018 Dr.Stallings

## 2018-05-04 ENCOUNTER — Other Ambulatory Visit: Payer: Self-pay

## 2018-05-04 ENCOUNTER — Telehealth (INDEPENDENT_AMBULATORY_CARE_PROVIDER_SITE_OTHER): Payer: BLUE CROSS/BLUE SHIELD | Admitting: Family Medicine

## 2018-05-04 DIAGNOSIS — Z23 Encounter for immunization: Secondary | ICD-10-CM

## 2018-05-04 DIAGNOSIS — N5201 Erectile dysfunction due to arterial insufficiency: Secondary | ICD-10-CM | POA: Diagnosis not present

## 2018-05-04 DIAGNOSIS — R7303 Prediabetes: Secondary | ICD-10-CM | POA: Diagnosis not present

## 2018-05-04 DIAGNOSIS — Z114 Encounter for screening for human immunodeficiency virus [HIV]: Secondary | ICD-10-CM

## 2018-05-04 DIAGNOSIS — N183 Chronic kidney disease, stage 3 unspecified: Secondary | ICD-10-CM

## 2018-05-04 DIAGNOSIS — I10 Essential (primary) hypertension: Secondary | ICD-10-CM

## 2018-05-04 MED ORDER — TADALAFIL 5 MG PO TABS: 5.0000 mg | ORAL_TABLET | Freq: Every day | ORAL | 11 refills | Status: DC | PRN

## 2018-05-04 MED ORDER — METFORMIN HCL 500 MG PO TABS: 500.0000 mg | ORAL_TABLET | Freq: Two times a day (BID) | ORAL | 1 refills | Status: DC

## 2018-05-04 MED ORDER — LISINOPRIL 20 MG PO TABS: 20.0000 mg | ORAL_TABLET | Freq: Every day | ORAL | 1 refills | Status: DC

## 2018-05-04 NOTE — Progress Notes (Signed)
CC: medication refill on tadalafil, metformin and lisinopril.  No concerns.  No travel outside the [place] or [place] with in the past 3 weeks.

## 2018-05-04 NOTE — Progress Notes (Signed)
Telemedicine Encounter- SOAP NOTE Established Patient  This telephone encounter was conducted with the patient's (or proxy's) verbal consent via audio telecommunications: yes/no: Yes Patient was instructed to have this encounter in a suitably private space; and to only have persons present to whom they give permission to participate. In addition, patient identity was confirmed by use of name plus two identifiers (DOB and address).  I discussed the limitations, risks, security and privacy concerns of performing an evaluation and management service by telephone and the availability of in person appointments. I also discussed with the patient that there may be a patient responsible charge related to this service. The patient expressed understanding and agreed to proceed.  I spent a total of TIME; 0 MIN TO 60 MIN: 25 minutes talking with the patient or their proxy.  CC: prediabetes, med refills   Subjective   Fred Holt is a 43 y.o. established patient. Telephone visit today for  HPI Prediabetes He is exercising He is compliant with metformin No side effects  He is eating a good diet And has cut back on red meats and is eating fish and poultry  Lab Results  Component Value Date   HGBA1C 6.1 (A) 07/28/2017   Hypertension He states that he is taking his bp meds He denies chest pains  No lower extremity edema BP Readings from Last 3 Encounters:  10/24/17 134/80  07/28/17 138/86  04/10/17 (!) 138/98    Lab Results  Component Value Date   CREATININE 1.59 (H) 07/28/2017     Morbid Obesity Patient reports that his last weight is 296 He states that he was doing some outside work since the weather warmed up 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 Prediabetes 09/11/2016     Past Medical History:  Diagnosis Date  . Allergy     Current Outpatient Medications  Medication Sig Dispense Refill  . diclofenac sodium (VOLTAREN) 1 % GEL Apply 2 g topically 4 (four) times daily. 100 g 0  . lisinopril (PRINIVIL,ZESTRIL) 20 MG tablet Take 1 tablet (20 mg total) by mouth daily. 30 tablet 0  . metFORMIN (GLUCOPHAGE) 500 MG tablet Take 1 tablet (500 mg total) by mouth 2 (two) times daily with a meal. 180 tablet 3  . MULTIPLE VITAMINS PO Take by mouth.    . tadalafil (CIALIS) 5 MG tablet Take 1 tablet (5 mg total) by mouth daily as needed for erectile dysfunction. 5 tablet 11   No current facility-administered medications for this visit.     No Known Allergies  Social History   Socioeconomic History  . Marital status: Single    Spouse name: n/a  . Number of children: 6  . Years of education: 57  . Highest education level: Not on file  Occupational History  . Occupation: truck Animator Needs  . Financial resource strain: Not on file  . Food insecurity:    Worry: Not on file    Inability: Not on file  . Transportation needs:    Medical: Not on file    Non-medical: Not on file  Tobacco Use  . Smoking status: Never Smoker  . Smokeless tobacco: Never Used  Substance and Sexual Activity  . Alcohol use: No  .  Drug use: No  . Sexual activity: Not on file  Lifestyle  . Physical activity:    Days per week: Not on file    Minutes per session: Not on file  . Stress: Not on file  Relationships  . Social connections:    Talks on phone: Not on file    Gets together: Not on file    Attends religious service: Not on file    Active member of club or organization: Not on file    Attends meetings of clubs or organizations: Not on file    Relationship status: Not on file  . Intimate partner violence:    Fear of current or ex partner: Not on file    Emotionally abused: Not on file    Physically abused: Not on file    Forced sexual activity: Not on file   Other Topics Concern  . Not on file  Social History Narrative   Lives alone. Sees his children daily.    ROS Review of Systems  Constitutional: Negative for activity change, appetite change, chills and fever.  HENT: Negative for congestion, nosebleeds, trouble swallowing and voice change.   Respiratory: Negative for cough, shortness of breath and wheezing.   Gastrointestinal: Negative for diarrhea, nausea and vomiting.  Genitourinary: Negative for difficulty urinating, dysuria, flank pain and hematuria.  Musculoskeletal: Negative for back pain, joint swelling and neck pain.  Neurological: Negative for dizziness, speech difficulty, light-headedness and numbness.  See HPI. All other review of systems negative.   Objective   Vitals as reported by the patient: There were no vitals filed for this visit.  Diagnoses and all orders for this visit:  Morbid obesity ([place])- discussed continued exercise -     Lipid panel; Future -     CMP14+EGFR; Future  Erectile dysfunction due to arterial insufficiency- Cialis refilled today  Prediabetes- will follow up with A1c Metformin helping with weight and blood glucose -     Hemoglobin A1c; Future  Essential hypertension- Patient's blood pressure is at goal of 139/89 or less. Condition is stable. Continue current medications and treatment plan. I recommend that you exercise for 30-45 minutes 5 days a week. I also recommend a balanced diet with fruits and vegetables every day, lean meats, and little fried foods. The DASH diet (you can find this online) is a good example of this.  -     Lipid panel; Future -     CMP14+EGFR; Future  CKD (chronic kidney disease) stage 3, GFR 30-59 ml/min (HCC)- discussed renal function and discussed avoiding NSAIDs -     CMP14+EGFR; Future  Encounter for screening for human immunodeficiency virus (HIV) -     HIV antibody (with reflex); Future  Need for vaccination -     Tdap vaccine greater than or equal to  7yo IM; Future     I discussed the assessment and treatment plan with the patient. The patient was provided an opportunity to ask questions and all were answered. The patient agreed with the plan and demonstrated an understanding of the instructions.   The patient was advised to call back or seek an in-person evaluation if the symptoms worsen or if the condition fails to improve as anticipated.  I provided 25 minutes of non-face-to-face time during this encounter.  Forrest Moron, MD  Primary Care at [facility]

## 2018-05-11 ENCOUNTER — Other Ambulatory Visit: Payer: Self-pay

## 2018-05-11 ENCOUNTER — Ambulatory Visit (INDEPENDENT_AMBULATORY_CARE_PROVIDER_SITE_OTHER): Payer: BLUE CROSS/BLUE SHIELD | Admitting: Family Medicine

## 2018-05-11 DIAGNOSIS — N183 Chronic kidney disease, stage 3 unspecified: Secondary | ICD-10-CM

## 2018-05-11 DIAGNOSIS — I10 Essential (primary) hypertension: Secondary | ICD-10-CM

## 2018-05-11 DIAGNOSIS — R7303 Prediabetes: Secondary | ICD-10-CM | POA: Diagnosis not present

## 2018-05-11 DIAGNOSIS — Z114 Encounter for screening for human immunodeficiency virus [HIV]: Secondary | ICD-10-CM

## 2018-05-11 NOTE — Progress Notes (Signed)
Pt came in for lab and bp 159/90, second time taken was 154/99

## 2018-05-12 ENCOUNTER — Telehealth: Payer: BLUE CROSS/BLUE SHIELD | Admitting: Family Medicine

## 2018-05-12 LAB — CMP14+EGFR
ALT: 50 IU/L — ABNORMAL HIGH (ref 0–44)
AST: 41 IU/L — ABNORMAL HIGH (ref 0–40)
Albumin/Globulin Ratio: 1.6 (ref 1.2–2.2)
Albumin: 4.4 g/dL (ref 4.0–5.0)
Alkaline Phosphatase: 82 IU/L (ref 39–117)
BUN/Creatinine Ratio: 8 — ABNORMAL LOW (ref 9–20)
BUN: 13 mg/dL (ref 6–24)
Bilirubin Total: 0.5 mg/dL (ref 0.0–1.2)
CO2: 22 mmol/L (ref 20–29)
Calcium: 9.8 mg/dL (ref 8.7–10.2)
Chloride: 104 mmol/L (ref 96–106)
Creatinine, Ser: 1.62 mg/dL — ABNORMAL HIGH (ref 0.76–1.27)
GFR calc Af Amer: 60 mL/min/{1.73_m2} (ref >59)
GFR calc non Af Amer: 52 mL/min/{1.73_m2} — ABNORMAL LOW (ref >59)
Globulin, Total: 2.8 g/dL (ref 1.5–4.5)
Glucose: 91 mg/dL (ref 65–99)
Potassium: 4.4 mmol/L (ref 3.5–5.2)
Sodium: 142 mmol/L (ref 134–144)
Total Protein: 7.2 g/dL (ref 6.0–8.5)

## 2018-05-12 LAB — HIV ANTIBODY (ROUTINE TESTING W REFLEX): HIV Screen 4th Generation wRfx: NONREACTIVE

## 2018-05-12 LAB — HEMOGLOBIN A1C
Est. average glucose Bld gHb Est-mCnc: 126 mg/dL
Hgb A1c MFr Bld: 6 % — ABNORMAL HIGH (ref 4.8–5.6)

## 2018-05-12 LAB — LIPID PANEL
Chol/HDL Ratio: 5.4 ratio — ABNORMAL HIGH (ref 0.0–5.0)
Cholesterol, Total: 157 mg/dL (ref 100–199)
HDL: 29 mg/dL — ABNORMAL LOW (ref >39)
LDL Calculated: 115 mg/dL — ABNORMAL HIGH (ref 0–99)
Triglycerides: 66 mg/dL (ref 0–149)
VLDL Cholesterol Cal: 13 mg/dL (ref 5–40)

## 2018-08-15 ENCOUNTER — Other Ambulatory Visit: Payer: Self-pay | Admitting: Family Medicine

## 2018-08-15 DIAGNOSIS — R7303 Prediabetes: Secondary | ICD-10-CM

## 2018-08-16 NOTE — Telephone Encounter (Signed)
Requested Prescriptions  Pending Prescriptions Disp Refills  . metFORMIN (GLUCOPHAGE) 500 MG tablet [Pharmacy Med Name: METFORMIN HCL 500 MG TABLET] 60 tablet 0    Sig: TAKE 1 TABLET (500 MG TOTAL) BY MOUTH 2 (TWO) TIMES DAILY WITH A MEAL.     Endocrinology:  Diabetes - Biguanides Failed - 08/15/2018 10:43 AM      Failed - Cr in normal range and within 360 days    Creatinine, Ser  Date Value Ref Range Status  05/11/2018 1.62 (H) 0.76 - 1.27 mg/dL Final         Failed - Valid encounter within last 6 months    Recent Outpatient Visits          3 months ago Morbid obesity ([facility])   Primary Care at [facility], Zoe A, MD   1 year ago CKD (chronic kidney disease) stage 3, GFR 30-59 ml/min (HCC)   Primary Care at [facility], Arlie Solomons, MD   1 year ago Prediabetes   Primary Care at [facility], [place], MD   1 year ago Uncontrolled hypertension   Primary Care at [facility], [place], MD   1 year ago Essential hypertension   Primary Care at [facility], Arlie Solomons, MD             Passed - HBA1C is between 0 and 7.9 and within 180 days    Hgb A1c MFr Bld  Date Value Ref Range Status  05/11/2018 6.0 (H) 4.8 - 5.6 % Final    Comment:             Prediabetes: 5.7 - 6.4          Diabetes: >6.4          Glycemic control for adults with diabetes: <7.0          Passed - eGFR in normal range and within 360 days    GFR calc Af Amer  Date Value Ref Range Status  05/11/2018 60 >59 mL/min/1.73 Final   GFR calc non Af Amer  Date Value Ref Range Status  05/11/2018 52 (L) >59 mL/min/1.73 Final

## 2018-08-25 DIAGNOSIS — Z20828 Contact with and (suspected) exposure to other viral communicable diseases: Secondary | ICD-10-CM | POA: Diagnosis not present

## 2018-09-07 DIAGNOSIS — Z20828 Contact with and (suspected) exposure to other viral communicable diseases: Secondary | ICD-10-CM | POA: Diagnosis not present

## 2018-10-24 DIAGNOSIS — G4733 Obstructive sleep apnea (adult) (pediatric): Secondary | ICD-10-CM | POA: Diagnosis not present

## 2018-10-29 ENCOUNTER — Other Ambulatory Visit: Payer: Self-pay | Admitting: Family Medicine

## 2018-10-29 DIAGNOSIS — I10 Essential (primary) hypertension: Secondary | ICD-10-CM

## 2018-10-29 DIAGNOSIS — N183 Chronic kidney disease, stage 3 unspecified: Secondary | ICD-10-CM

## 2018-10-29 NOTE — Telephone Encounter (Signed)
Requested medication (s) are due for refill today: yes  Requested medication (s) are on the active medication list: yes  Last refill:  08/02/2018  Future visit scheduled: no  Notes to clinic:  Review for refill   Requested Prescriptions  Pending Prescriptions Disp Refills   lisinopril (ZESTRIL) 20 MG tablet [Pharmacy Med Name: LISINOPRIL 20 MG TABLET] 90 tablet 1    Sig: TAKE 1 TABLET BY MOUTH EVERY DAY     Cardiovascular:  ACE Inhibitors Failed - 10/29/2018  1:36 AM      Failed - Cr in normal range and within 180 days    Creatinine, Ser  Date Value Ref Range Status  05/11/2018 1.62 (H) 0.76 - 1.27 mg/dL Final         Passed - K in normal range and within 180 days    Potassium  Date Value Ref Range Status  05/11/2018 4.4 3.5 - 5.2 mmol/L Final         Passed - Patient is not pregnant      Passed - Last BP in normal range    BP Readings from Last 1 Encounters:  10/24/17 134/80         Passed - Valid encounter within last 6 months    Recent Outpatient Visits          5 months ago Morbid obesity ([place])   Primary Care at [facility], Zoe A, MD   5 months ago Morbid obesity [facility])   Primary Care at [facility], Zoe A, MD   1 year ago CKD (chronic kidney disease) stage 3, GFR 30-59 ml/min ([place])   Primary Care at [facility], Arlie Solomons, MD   1 year ago Prediabetes   Primary Care at [facility], Arlie Solomons, MD   1 year ago Uncontrolled hypertension   Primary Care at [facility], Arlie Solomons, MD

## 2019-03-02 ENCOUNTER — Other Ambulatory Visit: Payer: Self-pay | Admitting: Family Medicine

## 2019-03-02 DIAGNOSIS — I10 Essential (primary) hypertension: Secondary | ICD-10-CM

## 2019-03-02 DIAGNOSIS — N183 Chronic kidney disease, stage 3 unspecified: Secondary | ICD-10-CM

## 2019-03-02 NOTE — Telephone Encounter (Signed)
Requested medication (s) are due for refill today: yes  Requested medication (s) are on the active medication list: yes  Last refill:  10/29/2018  Future visit scheduled: no  Notes to clinic:  no valid encounter within last 6 months    Requested Prescriptions  Pending Prescriptions Disp Refills   lisinopril (ZESTRIL) 20 MG tablet [Pharmacy Med Name: LISINOPRIL 20 MG TABLET] 90 tablet 0    Sig: TAKE 1 TABLET BY MOUTH EVERY DAY      Cardiovascular:  ACE Inhibitors Failed - 03/02/2019  2:29 PM      Failed - Cr in normal range and within 180 days    Creatinine, Ser  Date Value Ref Range Status  05/11/2018 1.62 (H) 0.76 - 1.27 mg/dL Final          Failed - K in normal range and within 180 days    Potassium  Date Value Ref Range Status  05/11/2018 4.4 3.5 - 5.2 mmol/L Final          Failed - Valid encounter within last 6 months    Recent Outpatient Visits           9 months ago Morbid obesity (HCC)   Primary Care at [facility], Manus Rudd, MD   10 months ago Morbid obesity [facility])   Primary Care at [facility], Zoe A, MD   1 year ago CKD (chronic kidney disease) stage 3, GFR 30-59 ml/min (HCC)   Primary Care at [facility], Manus Rudd, MD   1 year ago Prediabetes   Primary Care at [facility], [place], MD   2 years ago Uncontrolled hypertension   Primary Care at [facility], [place], [place]              Passed - Patient is not pregnant      Passed - Last BP in normal range    BP Readings from Last 1 Encounters:  10/24/17 134/80

## 2019-03-03 IMAGING — DX DG LUMBAR SPINE COMPLETE 4+V
5 series · 5 of 5 positions shown · non-contrast
Comparison: None.

CLINICAL DATA: Low back pain and sciatica

EXAM:
LUMBAR SPINE - COMPLETE 4+ VIEW

[l-spine ap]
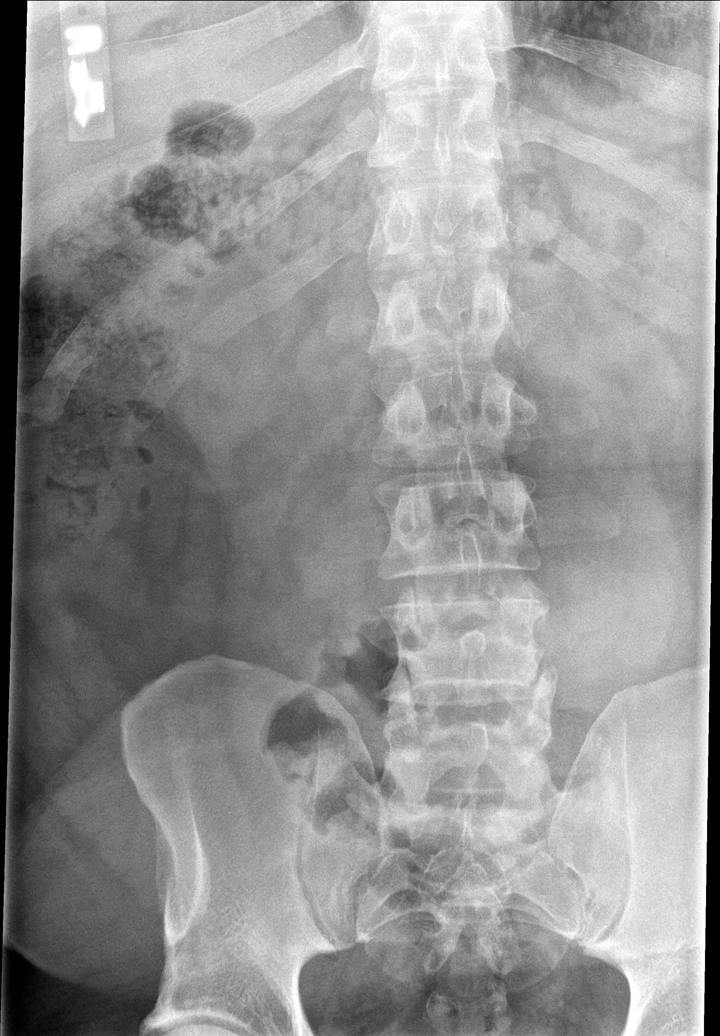

[l-spine obl (1 of 2)]
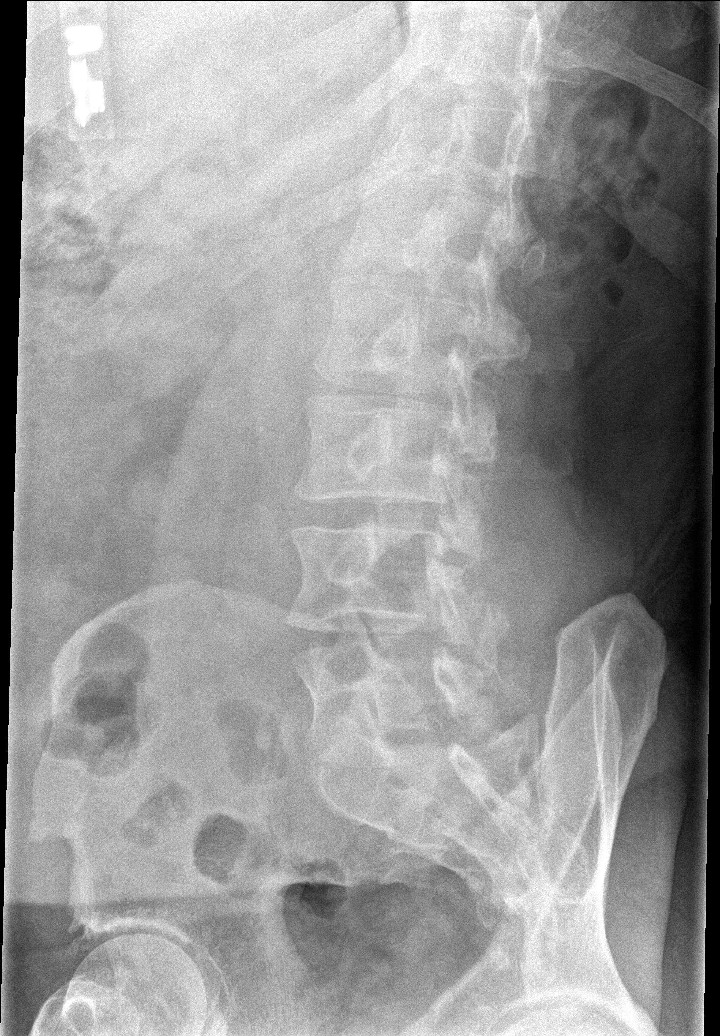

[l-spine obl (2 of 2)]
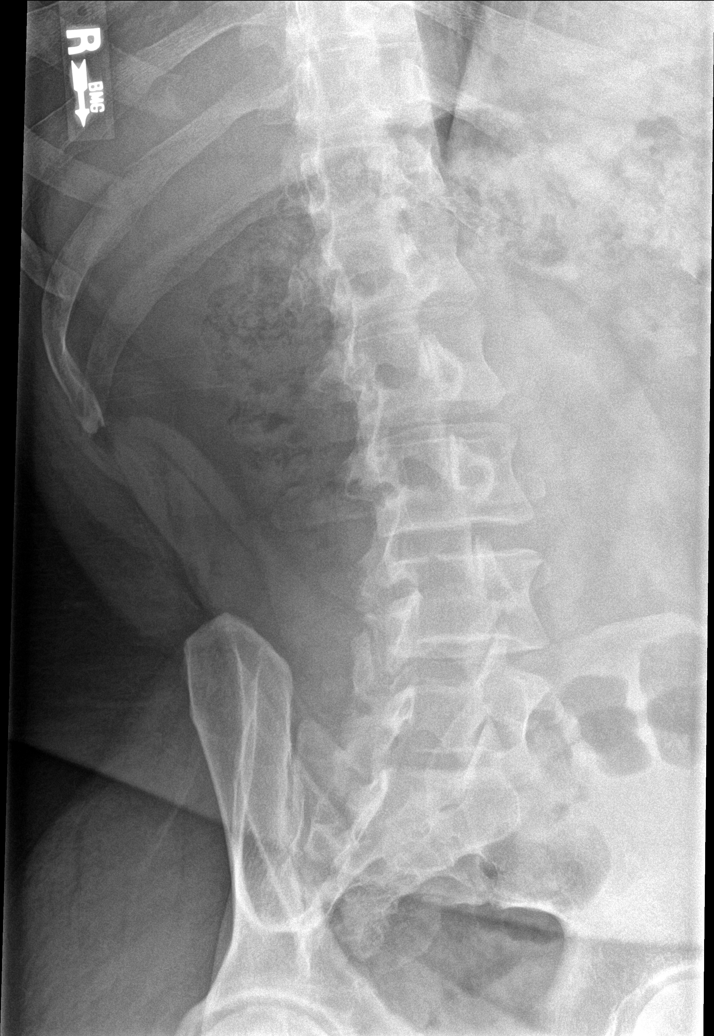

[l-spine lat]
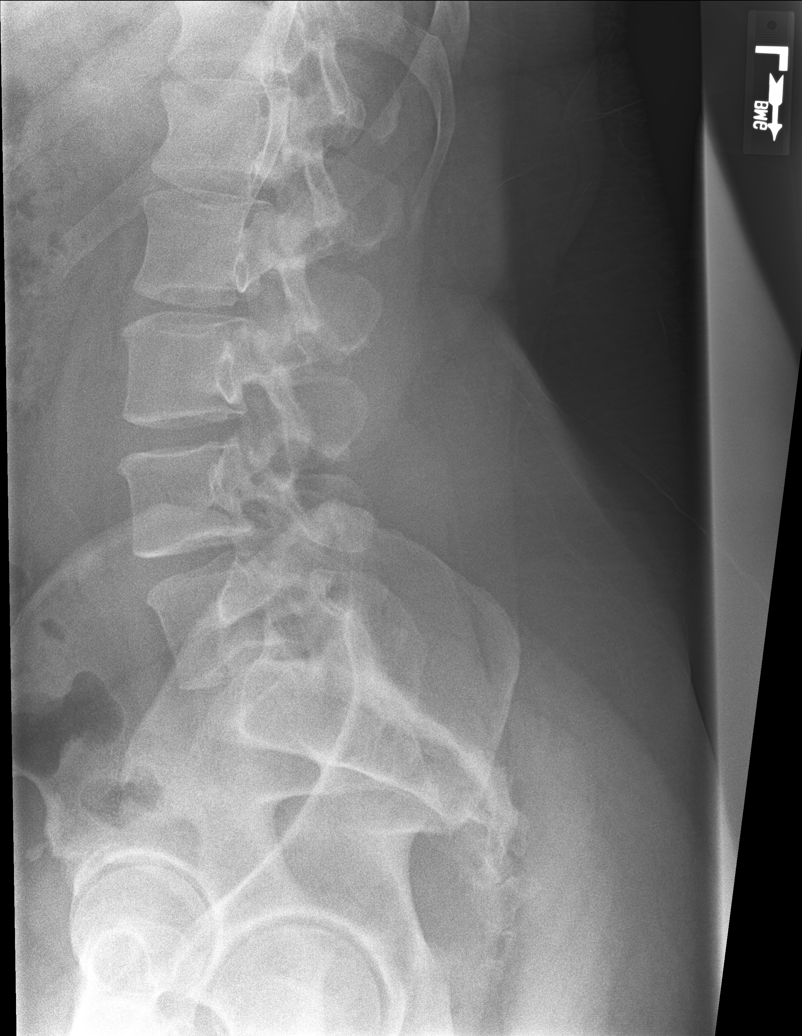

[l-spine l5-s1]
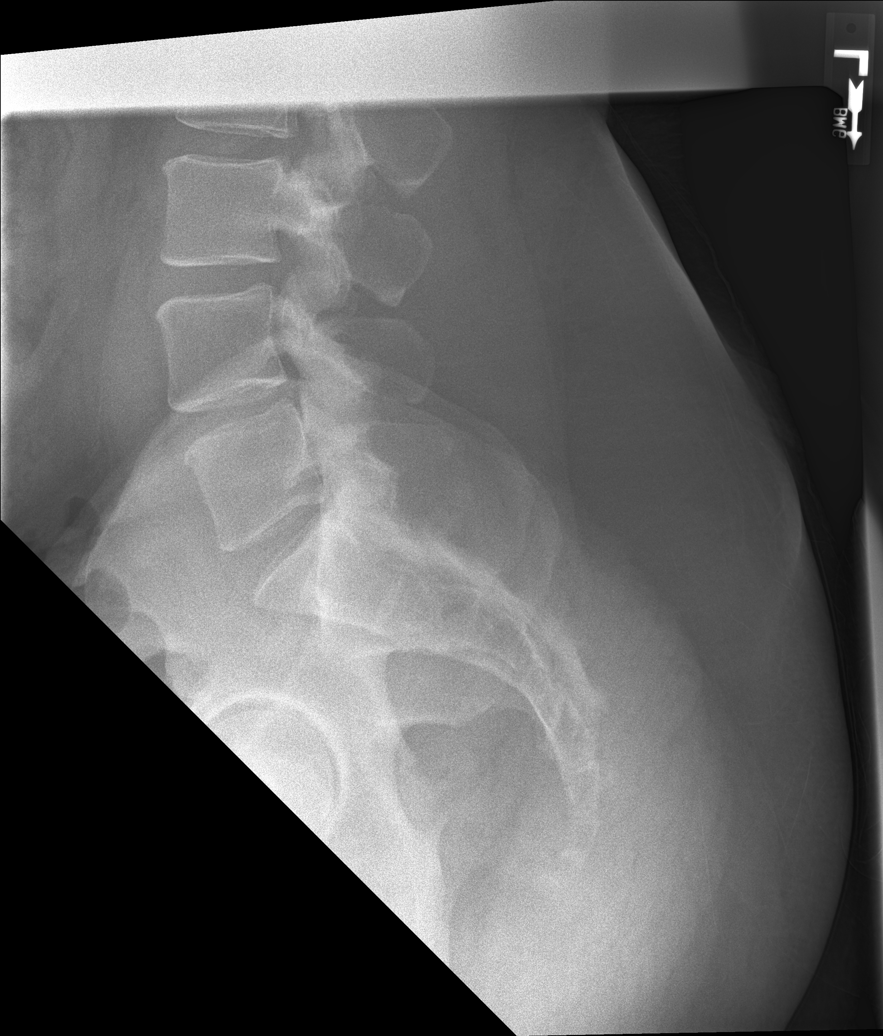

[5 of 5 positions shown; findings below may reference images not displayed]

FINDINGS: Five lumbar type vertebral bodies are well visualized. Vertebral
body height is well maintained. No pars defects are seen. No
anterolisthesis is noted. Mild disc space narrowing is seen at L4-5.
No soft tissue abnormality is noted.
IMPRESSION: Mild degenerative change without acute abnormality.

## 2019-03-22 ENCOUNTER — Encounter: Payer: Self-pay | Admitting: Family Medicine

## 2019-03-22 ENCOUNTER — Other Ambulatory Visit: Payer: Self-pay

## 2019-03-22 ENCOUNTER — Ambulatory Visit (INDEPENDENT_AMBULATORY_CARE_PROVIDER_SITE_OTHER): Payer: Self-pay | Admitting: Family Medicine

## 2019-03-22 VITALS — BP 150/90 | HR 78 | Temp 98.9°F | Ht 72.0 in | Wt 307.2 lb

## 2019-03-22 DIAGNOSIS — N182 Chronic kidney disease, stage 2 (mild): Secondary | ICD-10-CM | POA: Diagnosis not present

## 2019-03-22 DIAGNOSIS — I10 Essential (primary) hypertension: Secondary | ICD-10-CM

## 2019-03-22 DIAGNOSIS — R7303 Prediabetes: Secondary | ICD-10-CM

## 2019-03-22 LAB — POCT GLYCOSYLATED HEMOGLOBIN (HGB A1C): Hemoglobin A1C: 6.2 % — AB (ref 4.0–5.6)

## 2019-03-22 NOTE — Patient Instructions (Addendum)
  Your A1c increased to 6.2% Please increase your exercise Work on your diabetic diet Return to clinic in 2 months for blood pressure check   If you have lab work done today you will be contacted with your lab results within the next 2 weeks.  If you have not heard from [place] then please contact us. The fastest way to get your results is to register for My Chart.   IF you received an x-ray today, you will receive an invoice from [facility]. Please contact [facility] at [phone] with questions or concerns regarding your invoice.   IF you received labwork today, you will receive an invoice from [facility]. Please contact [facility] at [phone] with questions or concerns regarding your invoice.   Our billing staff will not be able to assist you with questions regarding bills from these companies.  You will be contacted with the lab results as soon as they are available. The fastest way to get your results is to activate your My Chart account. Instructions are located on the last page of this paperwork. If you have not heard from [place] regarding the results in 2 weeks, please contact this office.
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
## 2019-03-23 LAB — LIPID PANEL
Chol/HDL Ratio: 5.7 ratio — ABNORMAL HIGH (ref 0.0–5.0)
Cholesterol, Total: 160 mg/dL (ref 100–199)
HDL: 28 mg/dL — ABNORMAL LOW (ref >39)
LDL Chol Calc (NIH): 103 mg/dL — ABNORMAL HIGH (ref 0–99)
Triglycerides: 162 mg/dL — ABNORMAL HIGH (ref 0–149)
VLDL Cholesterol Cal: 29 mg/dL (ref 5–40)

## 2019-03-23 LAB — CMP14+EGFR
ALT: 54 IU/L — ABNORMAL HIGH (ref 0–44)
AST: 39 IU/L (ref 0–40)
Albumin/Globulin Ratio: 1.6 (ref 1.2–2.2)
Albumin: 4.6 g/dL (ref 4.0–5.0)
Alkaline Phosphatase: 92 IU/L (ref 39–117)
BUN/Creatinine Ratio: 10 (ref 9–20)
BUN: 18 mg/dL (ref 6–24)
Bilirubin Total: 0.5 mg/dL (ref 0.0–1.2)
CO2: 20 mmol/L (ref 20–29)
Calcium: 9.6 mg/dL (ref 8.7–10.2)
Chloride: 105 mmol/L (ref 96–106)
Creatinine, Ser: 1.73 mg/dL — ABNORMAL HIGH (ref 0.76–1.27)
GFR calc Af Amer: 55 mL/min/{1.73_m2} — ABNORMAL LOW (ref >59)
GFR calc non Af Amer: 47 mL/min/{1.73_m2} — ABNORMAL LOW (ref >59)
Globulin, Total: 2.9 g/dL (ref 1.5–4.5)
Glucose: 140 mg/dL — ABNORMAL HIGH (ref 65–99)
Potassium: 4.3 mmol/L (ref 3.5–5.2)
Sodium: 140 mmol/L (ref 134–144)
Total Protein: 7.5 g/dL (ref 6.0–8.5)

## 2019-03-23 LAB — VITAMIN D 25 HYDROXY (VIT D DEFICIENCY, FRACTURES): Vit D, 25-Hydroxy: 23.4 ng/mL — ABNORMAL LOW (ref 30.0–100.0)

## 2019-04-04 ENCOUNTER — Other Ambulatory Visit: Payer: Self-pay | Admitting: Family Medicine

## 2019-04-04 DIAGNOSIS — I10 Essential (primary) hypertension: Secondary | ICD-10-CM

## 2019-04-04 DIAGNOSIS — N183 Chronic kidney disease, stage 3 unspecified: Secondary | ICD-10-CM

## 2019-05-19 ENCOUNTER — Ambulatory Visit: Payer: Self-pay | Admitting: Family Medicine

## 2019-05-19 DIAGNOSIS — R7303 Prediabetes: Secondary | ICD-10-CM

## 2019-05-21 ENCOUNTER — Encounter: Payer: Self-pay | Admitting: Family Medicine

## 2019-08-25 DIAGNOSIS — G4733 Obstructive sleep apnea (adult) (pediatric): Secondary | ICD-10-CM | POA: Diagnosis not present

## 2019-09-06 ENCOUNTER — Other Ambulatory Visit: Payer: Self-pay

## 2019-09-06 ENCOUNTER — Emergency Department (HOSPITAL_COMMUNITY)
Admission: EM | Admit: 2019-09-06 | Discharge: 2019-09-07 | Disposition: A | Discharge status: Home / Self Care | Payer: BC Managed Care – PPO | Attending: Emergency Medicine | Admitting: Emergency Medicine

## 2019-09-06 DIAGNOSIS — Y929 Unspecified place or not applicable: Secondary | ICD-10-CM | POA: Diagnosis not present

## 2019-09-06 DIAGNOSIS — R21 Rash and other nonspecific skin eruption: Secondary | ICD-10-CM | POA: Diagnosis not present

## 2019-09-06 DIAGNOSIS — S0993XA Unspecified injury of face, initial encounter: Secondary | ICD-10-CM | POA: Diagnosis not present

## 2019-09-06 DIAGNOSIS — T7840XA Allergy, unspecified, initial encounter: Secondary | ICD-10-CM

## 2019-09-06 DIAGNOSIS — Y939 Activity, unspecified: Secondary | ICD-10-CM | POA: Diagnosis not present

## 2019-09-06 DIAGNOSIS — W57XXXA Bitten or stung by nonvenomous insect and other nonvenomous arthropods, initial encounter: Secondary | ICD-10-CM | POA: Diagnosis not present

## 2019-09-06 DIAGNOSIS — S0096XA Insect bite (nonvenomous) of unspecified part of head, initial encounter: Secondary | ICD-10-CM | POA: Diagnosis not present

## 2019-09-06 DIAGNOSIS — Y999 Unspecified external cause status: Secondary | ICD-10-CM | POA: Insufficient documentation

## 2019-09-06 DIAGNOSIS — R220 Localized swelling, mass and lump, head: Secondary | ICD-10-CM | POA: Insufficient documentation

## 2019-09-06 DIAGNOSIS — Z7984 Long term (current) use of oral hypoglycemic drugs: Secondary | ICD-10-CM | POA: Insufficient documentation

## 2019-09-06 DIAGNOSIS — L299 Pruritus, unspecified: Secondary | ICD-10-CM | POA: Diagnosis not present

## 2019-09-06 DIAGNOSIS — L500 Allergic urticaria: Secondary | ICD-10-CM | POA: Diagnosis not present

## 2019-09-06 DIAGNOSIS — L509 Urticaria, unspecified: Secondary | ICD-10-CM | POA: Insufficient documentation

## 2019-09-06 DIAGNOSIS — R7303 Prediabetes: Secondary | ICD-10-CM | POA: Diagnosis not present

## 2019-09-06 MED ORDER — PREDNISONE 10 MG PO TABS: 40.0000 mg | ORAL_TABLET | Freq: Every day | ORAL | 0 refills | Status: AC

## 2019-09-06 MED ORDER — FAMOTIDINE 20 MG PO TABS
20.0000 mg | ORAL_TABLET | Freq: Once | ORAL | Status: AC
  Administered 2019-09-06: 20 mg via ORAL
  Filled 2019-09-06: qty 1

## 2019-09-06 MED ORDER — FAMOTIDINE 20 MG PO TABS: 20.0000 mg | ORAL_TABLET | Freq: Every day | ORAL | 0 refills | Status: DC

## 2019-09-06 MED ORDER — PREDNISONE 20 MG PO TABS
60.0000 mg | ORAL_TABLET | Freq: Once | ORAL | Status: AC
  Administered 2019-09-06: 60 mg via ORAL
  Filled 2019-09-06: qty 3

## 2019-09-06 MED ORDER — DIPHENHYDRAMINE HCL 25 MG PO TABS: 25.0000 mg | ORAL_TABLET | Freq: Four times a day (QID) | ORAL | 0 refills | Status: DC

## 2019-09-06 MED ORDER — EPINEPHRINE 0.3 MG/0.3ML IJ SOAJ: 0.3000 mg | INTRAMUSCULAR | 0 refills | Status: DC | PRN

## 2019-09-06 NOTE — ED Triage Notes (Signed)
Pt arrived POV ambulatory into ED CC insect bite unknown on right FA. Pt eyes and face are swollen and reports itching all over. Pt reports taking 3 benadryl before arriving to ED. Pt denies SOB or airway tightness, denies tongue swelling.

## 2019-09-06 NOTE — ED Provider Notes (Signed)
[facility]  CSN: [id number] Arrival date & time: 09/06/19 2216  Chief Complaint(s) Insect Bite (Allergic Reaction )  HPI Fred Holt is a 44 y.o. male   The history is provided by the patient.  Urticaria This is a new problem. The current episode started 1 to 2 hours ago. The problem occurs constantly. The problem has been gradually improving. Pertinent negatives include no chest pain, no abdominal pain, no headaches and no shortness of breath. Nothing aggravates the symptoms. Relieved by: benadryl. The treatment provided mild relief.   Reports feeling bugs bite him while replacing a cover for his truck. Did not see the insects.   Past Medical History Past Medical History:  Diagnosis Date  . Allergy    Patient Active Problem List   Diagnosis Date Noted  . Prediabetes 09/11/2016   Home Medication(s) Prior to Admission medications   Medication Sig Start Date End Date Taking? Authorizing Provider  diclofenac sodium (VOLTAREN) 1 % GEL Apply 2 g topically 4 (four) times daily. 03/23/18   Doristine BosworthStallings, Zoe A, MD  diphenhydrAMINE (BENADRYL) 25 MG tablet Take 1 tablet (25 mg total) by mouth every 6 (six) hours for 5 days. 09/06/19 09/11/19  Nira Connardama, Abran Gavigan Eduardo, MD  EPINEPHrine 0.3 mg/0.3 mL IJ SOAJ injection Inject 0.3 mLs (0.3 mg total) into the muscle as needed for anaphylaxis. 09/06/19   Nira Connardama, Avionna Bower Eduardo, MD  famotidine (PEPCID) 20 MG tablet Take 1 tablet (20 mg total) by mouth daily for 5 days. 09/06/19 09/11/19  Nira Connardama, Analina Filla Eduardo, MD  lisinopril (ZESTRIL) 20 MG tablet TAKE 1 TABLET BY MOUTH EVERY DAY 04/04/19   Collie SiadStallings, Zoe A, MD  metFORMIN (GLUCOPHAGE) 500 MG tablet TAKE 1 TABLET (500 MG TOTAL) BY MOUTH 2 (TWO) TIMES DAILY WITH A MEAL. 08/16/18   Doristine BosworthStallings, Zoe A, MD  MULTIPLE VITAMINS PO Take by mouth.    [provider]  predniSONE (DELTASONE) 10 MG tablet Take 4 tablets (40 mg total) by mouth daily for 4 days. 09/06/19  09/10/19  Nira Connardama, Dorissa Stinnette Eduardo, MD  tadalafil (CIALIS) 5 MG tablet Take 1 tablet (5 mg total) by mouth daily as needed for erectile dysfunction. 05/04/18   Doristine BosworthStallings, Zoe A, MD                                                                                                                                    Past Surgical History Past Surgical History:  Procedure Laterality Date  . KIDNEY DONATION Left 2005   donation to sister   Family History Family History  Problem Relation Age of Onset  . Diabetes Mother   . Kidney disease Sister        s/p kidney transplant from this patient    Social History Social History   Tobacco Use  . Smoking status: Never Smoker  . Smokeless tobacco: Never Used  Substance Use Topics  . Alcohol use: No  .  Drug use: No   Allergies Patient has no known allergies.  Review of Systems Review of Systems  HENT: Positive for facial swelling. Negative for sore throat, trouble swallowing and voice change.   Respiratory: Negative for shortness of breath.   Cardiovascular: Negative for chest pain.  Gastrointestinal: Negative for abdominal pain.  Neurological: Negative for headaches.   All other systems are reviewed and are negative for acute change except as noted in the HPI  Physical Exam Vital Signs  I have reviewed the triage vital signs BP (!) 105/39   Pulse (!) 110   Temp 98.8 F (37.1 C)   SpO2 97%   Physical Exam Vitals reviewed.  Constitutional:      General: He is not in acute distress.    Appearance: He is well-developed. He is not diaphoretic.  HENT:     Head: Normocephalic and atraumatic.     Comments: Left periorbital swelling    Nose: Nose normal.  Eyes:     General: No scleral icterus.       Right eye: No discharge.        Left eye: No discharge.     Conjunctiva/sclera: Conjunctivae normal.     Pupils: Pupils are equal, round, and reactive to light.  Cardiovascular:     Rate and Rhythm: Normal rate and regular rhythm.      Heart sounds: No murmur heard.  No friction rub. No gallop.   Pulmonary:     Effort: Pulmonary effort is normal. No respiratory distress.     Breath sounds: Normal breath sounds. No stridor. No rales.  Abdominal:     General: There is no distension.     Palpations: Abdomen is soft.     Tenderness: There is no abdominal tenderness.  Musculoskeletal:        General: No tenderness.     Cervical back: Normal range of motion and neck supple.  Skin:    General: Skin is warm and dry.     Findings: Rash present. No erythema. Rash is urticarial (to face, torso, and extremity).  Neurological:     Mental Status: He is alert and oriented to person, place, and time.     ED Results and Treatments Labs (all labs ordered are listed, but only abnormal results are displayed) Labs Reviewed - No data to display                                                                                                                       EKG  EKG Interpretation  Date/Time:    Ventricular Rate:    PR Interval:    QRS Duration:   QT Interval:    QTC Calculation:   R Axis:     Text Interpretation:        Radiology No results found.  Pertinent labs & imaging results that were available during my care of the patient were reviewed by me and considered in my medical decision making (see chart for details).  Medications Ordered in ED Medications  predniSONE (DELTASONE) tablet 60 mg (60 mg Oral Given 09/06/19 2229)  famotidine (PEPCID) tablet 20 mg (20 mg Oral Given 09/06/19 2229)                                                                                                                                    Procedures Procedures  (including critical care time)  Medical Decision Making / ED Course I have reviewed the nursing notes for this encounter and the patient's prior records (if available in EHR or on provided paperwork).   Fred Holt was evaluated in Emergency Department on 09/07/2019 for  the symptoms described in the history of present illness. He was evaluated in the context of the global COVID-19 pandemic, which necessitated consideration that the patient might be at risk for infection with the SARS-CoV-2 virus that causes COVID-19. Institutional protocols and algorithms that pertain to the evaluation of patients at risk for COVID-19 are in a state of rapid change based on information released by regulatory bodies including the CDC and federal and state organizations. These policies and algorithms were followed during the patient's care in the ED.  44 y.o. male here with pruritic rash from insect bites. No respiratory, GI, or neurologic symptoms to suggest anaphylaxis.   Patient has taken benadryl prior to arrival.   On exam, there is no evidence of oral swelling or airway compromise.   Given H2 blocker, and steroids.   Monitored for additional 3 hrs. Symptoms without progression.  Safe for discharge with strict return precautions. Given Rx for H2 blocker and steroids.       Final Clinical Impression(s) / ED Diagnoses Final diagnoses:  Allergic reaction, initial encounter  Urticaria    The patient appears reasonably screened and/or stabilized for discharge and I doubt any other medical condition or other [facility] requiring further screening, evaluation, or treatment in the ED at this time prior to discharge. Safe for discharge with strict return precautions.  Disposition: Discharge  Condition: Good  I have discussed the results, Dx and Tx plan with the patient/family who expressed understanding and agree(s) with the plan. Discharge instructions discussed at length. The patient/family was given strict return precautions who verbalized understanding of the instructions. No further questions at time of discharge.    ED Discharge Orders         Ordered    diphenhydrAMINE (BENADRYL) 25 MG tablet  Every 6 hours     Discontinue  Reprint     09/06/19 2322    predniSONE  (DELTASONE) 10 MG tablet  Daily     Discontinue  Reprint     09/06/19 2322    famotidine (PEPCID) 20 MG tablet  Daily     Discontinue  Reprint     09/06/19 2322    EPINEPHrine 0.3 mg/0.3 mL IJ SOAJ injection  As needed     Discontinue  Reprint  09/06/19 2322             Follow Up: Doristine Bosworth, MD [phone] [address] [place] [address] [phone]        This chart was dictated using voice recognition software.  Despite best efforts to proofread,  errors can occur which can change the documentation meaning.   Nira Conn, MD 09/07/19 [phone]

## 2019-09-11 ENCOUNTER — Ambulatory Visit: Payer: No Typology Code available for payment source | Attending: Internal Medicine

## 2019-09-11 DIAGNOSIS — Z23 Encounter for immunization: Secondary | ICD-10-CM

## 2019-09-11 NOTE — Progress Notes (Signed)
   Covid-19 Vaccination Clinic  Name:  Fred Holt    MRN: [id number] DOB: 1975-08-12  09/11/2019  Mr. Fred Holt was observed post Covid-19 immunization for 15 minutes without incident. He was provided with Vaccine Information Sheet and instruction to access the V-Safe system.   [name] was instructed to call 911 with any severe reactions post vaccine: Marland Kitchen Difficulty breathing  . Swelling of face and throat  . A fast heartbeat  . A bad rash all over body  . Dizziness and weakness   Immunizations Administered    Name Date Dose VIS Date Route   Pfizer COVID-19 Vaccine 09/11/2019  1:57 PM 0.3 mL 03/24/2018 Intramuscular   Manufacturer: [employer], [employer]   Lot: [id number]   NDC: [address]-2

## 2019-10-15 DIAGNOSIS — G4733 Obstructive sleep apnea (adult) (pediatric): Secondary | ICD-10-CM | POA: Diagnosis not present

## 2019-10-26 DIAGNOSIS — Z1331 Encounter for screening for depression: Secondary | ICD-10-CM | POA: Diagnosis not present

## 2019-10-26 DIAGNOSIS — Z79899 Other long term (current) drug therapy: Secondary | ICD-10-CM | POA: Diagnosis not present

## 2019-10-26 DIAGNOSIS — N529 Male erectile dysfunction, unspecified: Secondary | ICD-10-CM | POA: Diagnosis not present

## 2019-10-26 DIAGNOSIS — I10 Essential (primary) hypertension: Secondary | ICD-10-CM | POA: Diagnosis not present

## 2019-11-18 DIAGNOSIS — Z1331 Encounter for screening for depression: Secondary | ICD-10-CM | POA: Diagnosis not present

## 2019-11-18 DIAGNOSIS — N529 Male erectile dysfunction, unspecified: Secondary | ICD-10-CM | POA: Diagnosis not present

## 2019-11-18 DIAGNOSIS — I10 Essential (primary) hypertension: Secondary | ICD-10-CM | POA: Diagnosis not present

## 2019-11-18 DIAGNOSIS — Z6841 Body Mass Index (BMI) 40.0 and over, adult: Secondary | ICD-10-CM | POA: Diagnosis not present

## 2023-05-12 ENCOUNTER — Encounter (HOSPITAL_COMMUNITY): Payer: Self-pay

## 2023-05-12 ENCOUNTER — Emergency Department (HOSPITAL_COMMUNITY)
Admission: EM | Admit: 2023-05-12 | Discharge: 2023-05-13 | Disposition: A | Discharge status: Home / Self Care | Payer: Self-pay | Attending: Emergency Medicine | Admitting: Emergency Medicine

## 2023-05-12 ENCOUNTER — Other Ambulatory Visit: Payer: Self-pay

## 2023-05-12 DIAGNOSIS — Z79899 Other long term (current) drug therapy: Secondary | ICD-10-CM | POA: Insufficient documentation

## 2023-05-12 DIAGNOSIS — I10 Essential (primary) hypertension: Secondary | ICD-10-CM | POA: Insufficient documentation

## 2023-05-12 LAB — COMPREHENSIVE METABOLIC PANEL WITH GFR
ALT: 48 U/L — ABNORMAL HIGH (ref 0–44)
AST: 39 U/L (ref 15–41)
Albumin: 4.1 g/dL (ref 3.5–5.0)
Alkaline Phosphatase: 72 U/L (ref 38–126)
Anion gap: 7 (ref 5–15)
BUN: 17 mg/dL (ref 6–20)
CO2: 26 mmol/L (ref 22–32)
Calcium: 9.3 mg/dL (ref 8.9–10.3)
Chloride: 108 mmol/L (ref 98–111)
Creatinine, Ser: 1.53 mg/dL — ABNORMAL HIGH (ref 0.61–1.24)
GFR, Estimated: 56 mL/min — ABNORMAL LOW (ref >60)
Glucose, Bld: 89 mg/dL (ref 70–99)
Potassium: 4.3 mmol/L (ref 3.5–5.1)
Sodium: 141 mmol/L (ref 135–145)
Total Bilirubin: 0.6 mg/dL (ref 0.0–1.2)
Total Protein: 7.4 g/dL (ref 6.5–8.1)

## 2023-05-12 LAB — CBC WITH DIFFERENTIAL/PLATELET
Abs Immature Granulocytes: 0.01 10*3/uL (ref 0.00–0.07)
Basophils Absolute: 0 10*3/uL (ref 0.0–0.1)
Basophils Relative: 0 %
Eosinophils Absolute: 0.2 10*3/uL (ref 0.0–0.5)
Eosinophils Relative: 3 %
HCT: 41.1 % (ref 39.0–52.0)
Hemoglobin: 13.6 g/dL (ref 13.0–17.0)
Immature Granulocytes: 0 %
Lymphocytes Relative: 39 %
Lymphs Abs: 2.7 10*3/uL (ref 0.7–4.0)
MCH: 31.1 pg (ref 26.0–34.0)
MCHC: 33.1 g/dL (ref 30.0–36.0)
MCV: 93.8 fL (ref 80.0–100.0)
Monocytes Absolute: 0.6 10*3/uL (ref 0.1–1.0)
Monocytes Relative: 9 %
Neutro Abs: 3.3 10*3/uL (ref 1.7–7.7)
Neutrophils Relative %: 49 %
Platelets: 224 10*3/uL (ref 150–400)
RBC: 4.38 MIL/uL (ref 4.22–5.81)
RDW: 12.8 % (ref 11.5–15.5)
WBC: 6.9 10*3/uL (ref 4.0–10.5)
nRBC: 0 % (ref 0.0–0.2)

## 2023-05-12 LAB — TROPONIN I (HIGH SENSITIVITY): Troponin I (High Sensitivity): 9 ng/L (ref <18)

## 2023-05-12 NOTE — ED Triage Notes (Signed)
 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 Denies known HBP. States has had a lot of weight gain over the last year. Denies cp, dizziness,headache, shob, vision changes or any other symptoms.

## 2023-05-12 NOTE — ED Provider Triage Note (Signed)
 Emergency Medicine Provider Triage Evaluation Note  Fred Holt , a 48 y.o. male  was evaluated in triage.  Pt complains of high blood pressure.  Patient was doing a physical for work when he was sent here for high blood pressure readings.  Pressures in the 200/100s.  Any chest pain, shortness of breath, vision changes, or headaches.  States that he put on a lot of weight in the past several months and think that could be associated with that.  Review of Systems  Positive: High blood pressure Negative: Chest pain, shortness of breath, vision changes, headaches  Physical Exam  BP (!) 214/123 (BP Location: Left Arm)   Pulse 84   Temp 99.9 F (37.7 C) (Oral)   Resp 18   Ht 6' (1.829 m)   Wt (!) 139.3 kg   SpO2 99%   BMI 41.65 kg/m  Gen:   Awake, no distress   Resp:  Normal effort  MSK:   Moves extremities without difficulty  Other:    Medical Decision Making  Medically screening exam initiated at 6:24 PM.  Appropriate orders placed.  Fred Holt was informed that the remainder of the evaluation will be completed by another provider, this initial triage assessment does not replace that evaluation, and the importance of remaining in the ED until their evaluation is complete.   Sonnie Dusky, PA-C 05/12/23 1825

## 2023-05-13 ENCOUNTER — Telehealth: Payer: Self-pay | Admitting: *Deleted

## 2023-05-13 MED ORDER — AMLODIPINE BESYLATE 5 MG PO TABS: 5.0000 mg | ORAL_TABLET | Freq: Every day | ORAL | 1 refills | Status: DC

## 2023-05-13 NOTE — ED Provider Notes (Signed)
 [place] EMERGENCY DEPARTMENT AT [facility] Provider Note   CSN: [id number] Arrival date & time: 05/12/23  1810     History  Chief Complaint  Patient presents with   Hypertension    Fred Holt is a 48 y.o. male.  The history is provided by the patient.  Hypertension  Fred Holt is a 48 y.o. male who presents to the Emergency Department complaining of elevated blood pressure.  He presents to the emergency department [facility] for evaluation of high blood pressure.  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  He denies any headache, chest pain, difficulty breathing, vision changes, nausea, vomiting.  He does have a remote history of high blood pressure but has been taken off of his medications due to weight loss and normalization of his blood pressure.  He currently does not have a primary care provider due to loss of insurance.  He is asymptomatic at this time.      Home Medications Prior to Admission medications   Medication Sig Start Date End Date Taking? Authorizing Provider  amLODipine (NORVASC) 5 MG tablet Take 1 tablet (5 mg total) by mouth daily. 05/13/23 06/12/23 Yes Tilden Fossa, MD  diclofenac sodium (VOLTAREN) 1 % GEL Apply 2 g topically 4 (four) times daily. 03/23/18   Doristine Bosworth, MD  diphenhydrAMINE (BENADRYL) 25 MG tablet Take 1 tablet (25 mg total) by mouth every 6 (six) hours for 5 days. 09/06/19 09/11/19  Nira Conn, MD  EPINEPHrine 0.3 mg/0.3 mL IJ SOAJ injection Inject 0.3 mLs (0.3 mg total) into the muscle as needed for anaphylaxis. 09/06/19   Nira Conn, MD  famotidine (PEPCID) 20 MG tablet Take 1 tablet (20 mg total) by mouth daily for 5 days. 09/06/19 09/11/19  Nira Conn, MD  lisinopril (ZESTRIL) 20 MG tablet TAKE 1 TABLET BY MOUTH EVERY DAY 04/04/19   Collie Siad A, MD  metFORMIN (GLUCOPHAGE) 500 MG tablet TAKE 1 TABLET (500 MG  TOTAL) BY MOUTH 2 (TWO) TIMES DAILY WITH A MEAL. 08/16/18   Doristine Bosworth, MD  MULTIPLE VITAMINS PO Take by mouth.    [provider]  tadalafil (CIALIS) 5 MG tablet Take 1 tablet (5 mg total) by mouth daily as needed for erectile dysfunction. 05/04/18   Doristine Bosworth, MD      Allergies    Patient has no known allergies.    Review of Systems   Review of Systems  All other systems reviewed and are negative.   Physical Exam Updated Vital Signs BP (!) 165/102 (BP Location: Right Arm)   Pulse 64   Temp 98.5 F (36.9 C) (Oral)   Resp 19   Ht 6' (1.829 m)   Wt (!) 139.3 kg   SpO2 99%   BMI 41.65 kg/m  Physical Exam Vitals and nursing note reviewed.  Constitutional:      Appearance: He is well-developed.  HENT:     Head: Normocephalic and atraumatic.  Cardiovascular:     Rate and Rhythm: Normal rate and regular rhythm.     Heart sounds: No murmur heard. Pulmonary:     Effort: Pulmonary effort is normal. No respiratory distress.     Breath sounds: Normal breath sounds.  Abdominal:     Palpations: Abdomen is soft.     Tenderness: There is no abdominal tenderness. There is no guarding or rebound.  Musculoskeletal:  General: No tenderness.     Comments: Trace edema to BLE  Skin:    General: Skin is warm and dry.  Neurological:     Mental Status: He is alert and oriented to person, place, and time.     Comments: Moves all extremities symmetrically.  Psychiatric:        Behavior: Behavior normal.     ED Results / Procedures / Treatments   Labs (all labs ordered are listed, but only abnormal results are displayed) Labs Reviewed  COMPREHENSIVE METABOLIC PANEL WITH GFR - Abnormal; Notable for the following components:      Result Value   Creatinine, Ser 1.53 (*)    ALT 48 (*)    GFR, Estimated 56 (*)    All other components within normal limits  CBC WITH DIFFERENTIAL/PLATELET  TROPONIN I (HIGH SENSITIVITY)    EKG EKG  Interpretation Date/Time:  Monday May 12 2023 19:35:12 EDT Ventricular Rate:  65 PR Interval:  167 QRS Duration:  87 QT Interval:  330 QTC Calculation: 343 R Axis:   85  Text Interpretation: Sinus rhythm Borderline repolarization abnormality Confirmed by Kelsey Patricia [phone]) on 05/13/2023 2:27:15 AM  Radiology No results found.  Procedures Procedures    Medications Ordered in ED Medications - No data to display  ED Course/ Medical Decision Making/ A&P                                 Medical Decision Making Risk Prescription drug management.   Patient with remote history of hypertension, not currently on medication here for evaluation of elevated blood pressure.  He is asymptomatic on evaluation with no evidence of acute endorgan dysfunction.  His blood pressures are elevated.  Discussed that he will need to restart on medication.  He does have renal insufficiency, BMP is improved when compared to priors.  Will place referral to the hypertension clinic.  Discussed importance of outpatient follow-up as well as return precautions for progressive or new concerning symptoms.        Final Clinical Impression(s) / ED Diagnoses Final diagnoses:  Uncontrolled hypertension    Rx / DC Orders ED Discharge Orders          Ordered    AMB Referral [facility]I Care Management        05/13/23 0257    amLODipine (NORVASC) 5 MG tablet  Daily        05/13/23 0300              Kelsey Patricia, MD 05/13/23 [phone]

## 2023-05-13 NOTE — Progress Notes (Signed)
 Care Guide Pharmacy Note  05/13/2023 Name: Jadon Harbaugh MRN: [id number] DOB: 1975/09/03  Referred By: Aquilla Knapp, MD (Inactive) Reason for referral: Complex Care Management (Initial outreach to schedule referral with PharmD )   Alphons Burgert is a 48 y.o. year old male who is a primary care patient of Stallings, Zoe A, MD (Inactive).  Hilding Quintanar was referred to the pharmacist for assistance related to: HTN  Successful contact was made with the patient to discuss pharmacy services including being ready for the pharmacist to call at least 5 minutes before the scheduled appointment time and to have medication bottles and any blood pressure readings ready for review. The patient agreed to meet with the pharmacist via telephone visit on (date/time). 05/23/23 at 10:00 AM   Gave information for 4/16 Mobile Clinic at  [facility]  [address] [place]  [address]  [facility]  [facility], [facility] Guide  Direct Dial: [phone]  Fax [phone]

## 2023-05-13 NOTE — Discharge Instructions (Signed)

## 2023-05-23 ENCOUNTER — Other Ambulatory Visit: Payer: Self-pay

## 2023-05-23 DIAGNOSIS — Z79899 Other long term (current) drug therapy: Secondary | ICD-10-CM

## 2023-05-23 NOTE — Progress Notes (Signed)
   05/23/2023  Patient ID: Fred Holt, male   DOB: July 01, 1975, 48 y.o.   MRN: [id number]  Contacted patient via telephone by referral of ED to follow up on recent ER visit for HTN.  Reviewed medication list and confirmed patient is only taking Amlodipine  5mg  1 tab daily for prescription, is taking as prescribed.  Denies any signs of low/high BP. Denies any issues with adherence. Checking BP daily with home upper arm BP cuff, reports this morning was 154/92.  Reviewed proper home monitoring BP technique. Counseled on limiting caffeine/sodium intake. Counseled on need for PCP, patient agrees to appt at [facility] on 07/09/23 at 3:40pm  Patient reports some cost concerns as he is out of work currently. Referral for SDOH needs placed. Patient would also like to discuss resources for insurance coverage at that time if possible.  Carnell Christian, PharmD Clinical Pharmacist [phone]

## 2023-05-27 ENCOUNTER — Other Ambulatory Visit: Payer: Self-pay

## 2023-05-27 NOTE — Patient Instructions (Signed)
 Visit Information  Thank you for taking time to visit with me today. Please don't hesitate to contact me if I can be of assistance to you before our next scheduled appointment.  Our next appointment is by telephone on 06/26/23 at 1:00 Please call the care guide team at [phone] if you need to cancel or reschedule your appointment.   Following is a copy of your care plan:   Goals Addressed   None     Please call the Suicide and Crisis Lifeline: 988 call the [place]  National Suicide Prevention Lifeline: 613-227-3086 or TTY: 636-330-0376 TTY 531-287-4767) to talk to a trained counselor call 1-800-273-TALK (toll free, 24 hour hotline) go to [facility] [address] [place] Krutz [phone]) call 911 if you are experiencing a Mental Health or Behavioral Health Crisis or need someone to talk to.  The patient verbalized understanding of instructions, educational materials, and care plan provided today and DECLINED offer to receive copy of patient instructions, educational materials, and care plan.   Valora Gear, Florestine Hurl, MHA [place]  [facility] Social Worker, Population Health [phone]

## 2023-05-27 NOTE — Patient Outreach (Signed)
 Complex Care Management   Visit Note  05/27/2023  Name:  Fred Holt MRN: [id number] DOB: May 24, 1975  Situation: Referral received for Complex Care Management related to  [facility]  I obtained verbal consent from Patient.  Visit completed with patient  on the phone  Background:   Past Medical History:  Diagnosis Date   Allergy     Assessment: SW completed a telephone outreach with patient for health insurance. Patient is self employed. Explained process on applying with [place] DSS and to provide tax records. SW also provided information for healthcare.gov to apply for insurance there. Patient states he will go to healthcare.gov and apply. No other needs or resources needed at this time.   Recommendation:   No recommendations needed at this time  Follow Up Plan:   Telephone follow-up in 1 month  Valora Gear, [place], [place] [facility]  [facility] Social Worker, Population Health [phone]

## 2023-06-03 ENCOUNTER — Telehealth: Payer: Self-pay | Admitting: *Deleted

## 2023-06-03 NOTE — Progress Notes (Signed)
 Complex Care Management Care Guide Note  06/03/2023 Name: Nirvaan Castiglia MRN: [id number] DOB: Jul 05, 1975  Mukul Krahmer is a 48 y.o. year old male who is a primary care patient of Tereasa Felty, Zoe A, MD (Inactive) and is actively engaged with the care management team.  Kord Demlow called by phone today to assist with  information about Mobile Clinic Calendar for 06/03/23.        Follow up plan: None   Barnie Bora  [facility], [facility] Guide  Direct Dial: [phone]  Fax [phone]

## 2023-06-06 ENCOUNTER — Other Ambulatory Visit: Payer: Self-pay | Admitting: Licensed Clinical Social Worker

## 2023-06-06 NOTE — Patient Outreach (Signed)
 Complex Care Management   Visit Note  06/06/2023  Name:  Fred Holt MRN: [id number] DOB: February 24, 1975  Situation: Referral received for Complex Care Management related to SDOH Barriers:  no insurance I obtained verbal consent from Patient.  Visit completed with patient  on the phone  Background:   Past Medical History:  Diagnosis Date   Allergy     Assessment: Patient Reported Symptoms:  Cognitive Cognitive Status: Alert and oriented to person, place, and time, Insightful and able to interpret abstract concepts, Normal speech and language skills Cognitive/Intellectual Conditions Management [RPT]: None reported or documented in medical history or problem list   Health Maintenance Behaviors: Annual physical exam, Exercise Healing Pattern: Unsure  Neurological Neurological Review of Symptoms: No symptoms reported Neurological Management Strategies: Activity  HEENT HEENT Symptoms Reported: No symptoms reported      Cardiovascular Cardiovascular Symptoms Reported: No symptoms reported Does patient have uncontrolled Hypertension?: Yes Is patient checking Blood Pressure at home?: No Patient's Recent BP reading at home: 140/70 - Last Clinic reading Cardiovascular Conditions: Hypertension Cardiovascular Management Strategies: Medication therapy, Activity Cardiovascular Self-Management Outcome: 3 (uncertain) Cardiovascular Comment: Wanting to establish insurance and PCP to address for work physical  Respiratory Respiratory Symptoms Reported: No symptoms reported    Endocrine Patient reports the following symptoms related to hypoglycemia or hyperglycemia : No symptoms reported    Gastrointestinal Gastrointestinal Symptoms Reported: No symptoms reported      Genitourinary Genitourinary Symptoms Reported: No symptoms reported    Integumentary Integumentary Symptoms Reported: No symptoms reported    Musculoskeletal Musculoskelatal Symptoms Reviewed: Muscle pain Additional  Musculoskeletal Details: Reports ongoing back pain from a pinched nerve - was advised to have surgery years ago but declined at the time. Advised to address with new PCP Musculoskeletal Conditions: Back pain Musculoskeletal Management Strategies: Activity Musculoskeletal Self-Management Outcome: 3 (uncertain) Falls in the past year?: No Number of falls in past year: 1 or less Was there an injury with Fall?: No Fall Risk Category Calculator: 0 Patient Fall Risk Level: Low Fall Risk Patient at Risk for Falls Due to: No Fall Risks  Psychosocial Psychosocial Symptoms Reported: No symptoms reported Additional Psychological Details: Denied any MH concerns at this time.     Quality of Family Relationships: helpful, involved, supportive Do you feel physically threatened by others?: No      06/06/2023    1:36 PM  Depression screen PHQ 2/9  Decreased Interest 0  Down, Depressed, Hopeless 0  PHQ - 2 Score 0    There were no vitals filed for this visit.  Medications Reviewed Today     Reviewed by Jens Molder, LCSW (Social Worker) on 06/06/23 at 1313  Med List Status: <None>   Medication Order Taking? Sig Documenting Provider Last Dose Status Informant  amLODipine  (NORVASC ) 5 MG tablet [id number] Yes Take 1 tablet (5 mg total) by mouth daily. Kelsey Patricia, MD Taking Active   MULTIPLE VITAMINS PO [id number] Yes Take by mouth. [provider] Taking Active             Recommendation:   Follow up with community resources re: insurance - Health insurance marketplace and Health insurance [employer]. Follow up with provided clinics regarding underinsured options.  Follow Up Plan:   Telephone follow up appointment date/time:  07/11/2023  Hale Level, LCSW [place], [facility] Health Licensed Clinical Social Worker Care Coordinator [phone]

## 2023-06-10 ENCOUNTER — Encounter: Payer: Self-pay | Admitting: Physician Assistant

## 2023-06-10 ENCOUNTER — Ambulatory Visit: Payer: Self-pay | Admitting: Physician Assistant

## 2023-06-10 VITALS — BP 156/102 | HR 97 | Ht 72.0 in | Wt 305.0 lb

## 2023-06-10 DIAGNOSIS — N289 Disorder of kidney and ureter, unspecified: Secondary | ICD-10-CM

## 2023-06-10 DIAGNOSIS — I10 Essential (primary) hypertension: Secondary | ICD-10-CM

## 2023-06-10 DIAGNOSIS — Z905 Acquired absence of kidney: Secondary | ICD-10-CM

## 2023-06-10 MED ORDER — LISINOPRIL 10 MG PO TABS: 10.0000 mg | ORAL_TABLET | Freq: Every day | ORAL | 1 refills | Status: DC

## 2023-06-10 NOTE — Patient Instructions (Addendum)
 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 Despite taking amlodipine , his blood pressure remains high during these evaluations. He is actively making lifestyle changes and has a history of prediabetes, which is currently well-managed.  YOUR PLAN:  -HYPERTENSION: Hypertension means high blood pressure. Your blood pressure readings are elevated during DOT physicals. We will add lisinopril  10 mg daily to your current amlodipine  regimen. Please take both medications together in the morning.   How to Take Your Blood Pressure Blood pressure is a measurement of how strongly your blood is pressing against the walls of your arteries. Arteries are blood vessels that carry blood from your heart throughout your body. Your health care provider takes your blood pressure at each office visit. You can also take your own blood pressure at home with a blood pressure monitor. You may need to take your own blood pressure to: Confirm a diagnosis of high blood pressure (hypertension). Monitor your blood pressure over time. Make sure your blood pressure medicine is working. Supplies needed: Blood pressure monitor. A chair to sit in. This should be a chair where you can sit upright with your back supported. Do not sit on a soft couch or an armchair. Table or desk. Small notebook and pencil or pen. How to prepare To get the most accurate reading, avoid the following for 30 minutes before you check your blood pressure: Drinking caffeine. Drinking alcohol. Eating. Smoking. Exercising. Five minutes before you check your blood pressure: Use the bathroom and urinate so that you have an empty bladder. Sit quietly in a chair. Do not talk. How to take your blood pressure To check your blood pressure, follow the instructions in the manual that came with your blood pressure monitor. If you have a digital blood  pressure monitor, the instructions may be as follows: Sit up straight in a chair. Place your feet on the floor. Do not cross your ankles or legs. Rest your left arm at the level of your heart on a table or desk or on the arm of a chair. Pull up your shirt sleeve. Wrap the blood pressure cuff around the upper part of your left arm, 1 inch (2.5 cm) above your elbow. It is best to wrap the cuff around bare skin. Fit the cuff snugly, but not too tightly, around your arm. You should be able to place only one finger between the cuff and your arm. Position the cord so that it rests in the bend of your elbow. Press the power button. Sit quietly while the cuff inflates and deflates. Read the digital reading on the monitor screen and write the numbers down (record them) in a notebook. Wait 2-3 minutes, then repeat the steps, starting at step 1. What does my blood pressure reading mean? A blood pressure reading consists of a higher number over a lower number. Ideally, your blood pressure should be below 120/80. The first ("top") number is called the systolic pressure. It is a measure of the pressure in your arteries as your heart beats. The second ("bottom") number is called the diastolic pressure. It is a measure of the pressure in your arteries as the heart relaxes. Blood pressure is classified into four stages. The following are the stages for adults who do not have a short-term serious illness or a chronic condition. Systolic pressure and diastolic pressure are measured in a unit called mm Hg (millimeters of mercury).  Normal Systolic pressure: below  120. Diastolic pressure: below 80. Elevated Systolic pressure: 120-129. Diastolic pressure: below 80. Hypertension stage 1 Systolic pressure: 130-139. Diastolic pressure: 80-89. Hypertension stage 2 Systolic pressure: 140 or above. Diastolic pressure: 90 or above. You can have elevated blood pressure or hypertension even if only the systolic or only  the diastolic number in your reading is higher than normal. Follow these instructions at home: Medicines Take over-the-counter and prescription medicines only as told by your health care provider. Tell your health care provider if you are having any side effects from blood pressure medicine. General instructions Check your blood pressure as often as recommended by your health care provider. Check your blood pressure at the same time every day. Take your monitor to the next appointment with your health care provider to make sure that: You are using it correctly. It provides accurate readings. Understand what your goal blood pressure numbers are. Keep all follow-up visits. This is important. General tips Your health care provider can suggest a reliable monitor that will meet your needs. There are several types of home blood pressure monitors. Choose a monitor that has an arm cuff. Do not choose a monitor that measures your blood pressure from your wrist or finger. Choose a cuff that wraps snugly, not too tight or too loose, around your upper arm. You should be able to fit only one finger between your arm and the cuff. You can buy a blood pressure monitor at most drugstores or online. Where to find more information American Heart Association: www.heart.org Contact a health care provider if: Your blood pressure is consistently high. Your blood pressure is suddenly low. Get help right away if: Your systolic blood pressure is higher than 180. Your diastolic blood pressure is higher than 120. These symptoms may be an emergency. Get help right away. Call 911. Do not wait to see if the symptoms will go away. Do not drive yourself to the hospital. Summary Blood pressure is a measurement of how strongly your blood is pressing against the walls of your arteries. A blood pressure reading consists of a higher number over a lower number. Ideally, your blood pressure should be below 120/80. Check  your blood pressure at the same time every day. Avoid caffeine, alcohol, smoking, and exercise for 30 minutes prior to checking your blood pressure. These agents can affect the accuracy of the blood pressure reading. This information is not intended to replace advice given to you by your health care provider. Make sure you discuss any questions you have with your health care provider. Document Revised: 09/28/2020 Document Reviewed: 09/28/2020 Elsevier Patient Education  2024 [employer].

## 2023-06-10 NOTE — Progress Notes (Unsigned)
   New Patient Office Visit  Subjective    Patient ID: Fred Holt, male    DOB: October 22, 1975  Age: 48 y.o. MRN: [id number]  CC:  Chief Complaint  Patient presents with  . Hypertension    HPI [name] presents to establish care ***  Outpatient Encounter Medications as of 06/10/2023  Medication Sig  . amLODipine  (NORVASC ) 5 MG tablet Take 1 tablet (5 mg total) by mouth daily.  . MULTIPLE VITAMINS PO Take by mouth.   No facility-administered encounter medications on file as of 06/10/2023.    Past Medical History:  Diagnosis Date  . Allergy     Past Surgical History:  Procedure Laterality Date  . KIDNEY DONATION Left 2005   donation to sister    Family History  Problem Relation Age of Onset  . Diabetes Mother   . Kidney disease Sister        s/p kidney transplant from this patient    Social History   Socioeconomic History  . Marital status: Single    Spouse name: n/a  . Number of children: 6  . Years of education: 18  . Highest education level: Not on file  Occupational History  . Occupation: truck Hospital doctor  Tobacco Use  . Smoking status: Never  . Smokeless tobacco: Never  Substance and Sexual Activity  . Alcohol use: No  . Drug use: No  . Sexual activity: Not on file  Other Topics Concern  . Not on file  Social History Narrative   Lives alone. Sees his children daily.   Social Drivers of Corporate investment banker Strain: Low Risk  (05/27/2023)   Overall Financial Resource Strain (CARDIA)   . Difficulty of Paying Living Expenses: Not hard at all  Food Insecurity: No Food Insecurity (06/06/2023)   Hunger Vital Sign   . Worried About Programme researcher, broadcasting/film/video in the Last Year: Never true   . Ran Out of Food in the Last Year: Never true  Transportation Needs: No Transportation Needs (06/06/2023)   PRAPARE - Transportation   . Lack of Transportation (Medical): No   . Lack of Transportation (Non-Medical): No  Physical Activity: Not on file  Stress:  Not on file  Social Connections: Not on file  Intimate Partner Violence: Not At Risk (06/06/2023)   Humiliation, Afraid, Rape, and Kick questionnaire   . Fear of Current or Ex-Partner: No   . Emotionally Abused: No   . Physically Abused: No   . Sexually Abused: No    ROS      Objective    There were no vitals taken for this visit.  Physical Exam  {Labs (Optional):23779}    Assessment & Plan:   Problem List Items Addressed This Visit   None   No follow-ups on file.   Etter Hermann Mayers, PA-C

## 2023-06-11 ENCOUNTER — Encounter: Payer: Self-pay | Admitting: Physician Assistant

## 2023-06-11 DIAGNOSIS — I10 Essential (primary) hypertension: Secondary | ICD-10-CM | POA: Insufficient documentation

## 2023-06-11 DIAGNOSIS — Z905 Acquired absence of kidney: Secondary | ICD-10-CM | POA: Insufficient documentation

## 2023-06-13 ENCOUNTER — Ambulatory Visit: Payer: Self-pay

## 2023-06-26 ENCOUNTER — Other Ambulatory Visit: Payer: Self-pay

## 2023-06-26 NOTE — Patient Outreach (Signed)
 Complex Care Management   Visit Note  06/26/2023  Name:  Fred Holt MRN: [id number] DOB: 1975/09/06  Situation: Referral received for Complex Care Management related to [facility] I obtained verbal consent from Patient.  Visit completed with patient  on the phone  Background:   Past Medical History:  Diagnosis Date   Allergy    Hypertension     Assessment: SW completed a telephone outreach with patient, he states she has not went online to apply for insurance. Patient states he did complete and turn in some paperwork, but has not received any information yet. No other resources are needed at this time.  SDOH Interventions    Flowsheet Row Patient Outreach Telephone from 06/06/2023 in [place] POPULATION HEALTH DEPARTMENT  SDOH Interventions   Food Insecurity Interventions Intervention Not Indicated  Housing Interventions Intervention Not Indicated  Transportation Interventions Intervention Not Indicated  Utilities Interventions Intervention Not Indicated       Recommendation:   No recommendations at this time.  Follow Up Plan:   Telephone follow-up in 1 month  Valora Gear, BSW, [facility]  [facility] Social Worker, Population Health [phone]

## 2023-06-26 NOTE — Patient Instructions (Signed)
 Visit Information  Thank you for taking time to visit with me today. Please don't hesitate to contact me if I can be of assistance to you before our next scheduled appointment.  Your next care management appointment is by telephone on 07/25/23 at 1:30   Please call the care guide team at [phone] if you need to cancel, schedule, or reschedule an appointment.   Please call the Suicide and Crisis Lifeline: 988 call the [place]  National Suicide Prevention Lifeline: (586)524-2053 or TTY: 726-819-1096 TTY 302-336-3544) to talk to a trained counselor call 1-800-273-TALK (toll free, 24 hour hotline) go to [facility] [address], [place] [phone]) call 911 if you are experiencing a Mental Health or Behavioral Health Crisis or need someone to talk to.  Valora Gear, Florestine Hurl, MHA [place]  [facility] Social Worker, Population Health [phone]

## 2023-07-09 ENCOUNTER — Ambulatory Visit: Payer: Self-pay | Admitting: Nurse Practitioner

## 2023-07-11 ENCOUNTER — Other Ambulatory Visit: Payer: Self-pay | Admitting: Licensed Clinical Social Worker

## 2023-07-11 NOTE — Patient Outreach (Signed)
 Complex Care Management   Visit Note  07/11/2023  Name:  Fred Holt MRN: [id number] DOB: September 24, 1975  Situation: Referral received for Complex Care Management related to underinsured I obtained verbal consent from Patient.  Visit completed with patient  on the phone  Background:   Past Medical History:  Diagnosis Date   Allergy    Hypertension     Assessment: Patient Reported Symptoms:  Cognitive Cognitive Status: Alert and oriented to person, place, and time, Insightful and able to interpret abstract concepts, Normal speech and language skills Cognitive/Intellectual Conditions Management [RPT]: None reported or documented in medical history or problem list   Health Maintenance Behaviors: Annual physical exam Healing Pattern: Unsure  Neurological Neurological Review of Symptoms: No symptoms reported    HEENT HEENT Symptoms Reported: No symptoms reported      Cardiovascular Cardiovascular Symptoms Reported: No symptoms reported    Respiratory Respiratory Symptoms Reported: No symptoms reported    Endocrine Patient reports the following symptoms related to hypoglycemia or hyperglycemia : No symptoms reported    Gastrointestinal Gastrointestinal Symptoms Reported: No symptoms reported      Genitourinary Genitourinary Symptoms Reported: No symptoms reported    Integumentary Integumentary Symptoms Reported: No symptoms reported    Musculoskeletal Musculoskelatal Symptoms Reviewed: Muscle pain Additional Musculoskeletal Details: Plans to address once established with PCP Musculoskeletal Conditions: Back pain Musculoskeletal Management Strategies: Activity Musculoskeletal Self-Management Outcome: 3 (uncertain) Falls in the past year?: No Number of falls in past year: 1 or less Was there an injury with Fall?: No Fall Risk Category Calculator: 0 Patient Fall Risk Level: Low Fall Risk Patient at Risk for Falls Due to: No Fall Risks  Psychosocial Psychosocial Symptoms  Reported: No symptoms reported            06/10/2023    5:48 PM  Depression screen PHQ 2/9  Decreased Interest 0  Down, Depressed, Hopeless 0  PHQ - 2 Score 0  Altered sleeping 0  Tired, decreased energy 0  Change in appetite 0  Feeling bad or failure about yourself  0  Trouble concentrating 0  Moving slowly or fidgety/restless 0  Suicidal thoughts 0  PHQ-9 Score 0  Difficult doing work/chores Not difficult at all    There were no vitals filed for this visit.  Medications Reviewed Today     Reviewed by Jens Molder, LCSW (Social Worker) on 07/11/23 at 1317  Med List Status: <None>   Medication Order Taking? Sig Documenting Provider Last Dose Status Informant  amLODipine  (NORVASC ) 5 MG tablet [id number] No Take 1 tablet (5 mg total) by mouth daily. Kelsey Patricia, MD Taking Expired 06/12/23 2359   lisinopril  (ZESTRIL ) 10 MG tablet [id number]  Take 1 tablet (10 mg total) by mouth daily. Mayers, Cari S, PA-C  Active   MULTIPLE VITAMINS PO [id number] No Take by mouth. [provider] Taking Active             Recommendation:   Pt reported he visited health care.gov but with his finances changing - being able to work again he was not able to apply for insurance. Pt plans on visiting again to apply for insurance. CSW also gave pt information [employer] for insurance options and pt agreed to reach out to them. We discussed underinsured health clinic and CSW will send pt information [employer] [facility]. Pt agreed to contact them for initial appt.   Follow Up Plan:   Patient has met all care management goals. Care Management case will  be closed. Patient has been provided contact information should new needs arise.   Hale Level, LCSW [place], [facility] Licensed Clinical Social Worker Care Coordinator [phone]

## 2023-07-11 NOTE — Patient Instructions (Signed)
 Visit Information  Thank you for taking time to visit with me today. Please don't hesitate to contact me if I can be of assistance to you before our next scheduled appointment.  Your next care management appointment is no further scheduled appointments.    Please call the care guide team at [phone] if you need to cancel, schedule, or reschedule an appointment.   Please call the Suicide and Crisis Lifeline: 988 if you are experiencing a Mental Health or Behavioral Health Crisis or need someone to talk to.  Hale Level, LCSW [facility], [facility] Licensed Clinical Social Worker Care Coordinator [phone]

## 2023-07-25 ENCOUNTER — Other Ambulatory Visit: Payer: Self-pay

## 2023-07-25 ENCOUNTER — Other Ambulatory Visit: Payer: Self-pay | Admitting: Nurse Practitioner

## 2023-07-25 DIAGNOSIS — I10 Essential (primary) hypertension: Secondary | ICD-10-CM

## 2023-07-25 MED ORDER — AMLODIPINE BESYLATE 5 MG PO TABS: 5.0000 mg | ORAL_TABLET | Freq: Every day | ORAL | 1 refills | Status: DC

## 2023-07-25 MED ORDER — AMLODIPINE BESYLATE 5 MG PO TABS: 5.0000 mg | ORAL_TABLET | Freq: Every day | ORAL | 0 refills | Status: AC

## 2023-07-25 MED ORDER — LISINOPRIL 10 MG PO TABS
10.0000 mg | ORAL_TABLET | Freq: Every day | ORAL | 1 refills | Status: DC
Start: 2023-07-25 — End: 2023-07-25

## 2023-07-25 MED ORDER — LISINOPRIL 10 MG PO TABS: 10.0000 mg | ORAL_TABLET | Freq: Every day | ORAL | 0 refills | Status: AC

## 2023-07-25 NOTE — Patient Outreach (Signed)
 Complex Care Management   Visit Note  07/25/2023  Name:  Fred Holt MRN: [id number] DOB: [birth year]  Situation: Referral received for Complex Care Management related to [facility] I obtained verbal consent from Patient.  Visit completed with patient  on the phone  Background:   Past Medical History:  Diagnosis Date   Allergy    Hypertension     Assessment:with SW completed a telephone outreach with patient, he states he has applied for insurance through the marketplace and has not received a decision. Patient states no other resources are needed at this time. Patient is requesting a refill for lisinopril  and amlodipine . SW will send physician a message for patient.  SDOH Interventions    Flowsheet Row Patient Outreach Telephone from 06/06/2023 in [place] [facility]ATION HE[facility] DEPARTMENT  SDOH Interventions   Food Insecurity Interventions Intervention Not Indicated  Housing Interventions Intervention Not Indicated  Transportation Interventions Intervention Not Indicated  Utilities Interventions Intervention Not Indicated     Recommendation:   No recommendations at this time.  Follow Up Plan:   Patient has met all care management goals. Care Management case will be closed. Patient has been provided contact information should new needs arise.  Thersia Hoar, HEDWIG, MHA [place]  [facility] Social Worker, Population Health [phone]

## 2023-07-25 NOTE — Patient Instructions (Signed)
